# Patient Record
Sex: Male | Born: 1955
Health system: Southern US, Community
[De-identification: ages and names within clinical notes are randomized; demographics above are authoritative.]

## PROBLEM LIST (undated history)

## (undated) DIAGNOSIS — I10 Essential (primary) hypertension: Secondary | ICD-10-CM

## (undated) DIAGNOSIS — M5440 Lumbago with sciatica, unspecified side: Secondary | ICD-10-CM

## (undated) HISTORY — PX: TONSILLECTOMY: SHX5217

## (undated) HISTORY — DX: Essential (primary) hypertension: I10

## (undated) HISTORY — DX: Lumbago with sciatica, unspecified side: M54.40

## (undated) HISTORY — PX: FOOT SURGERY: SHX648

## (undated) HISTORY — PX: NOSE SURGERY: SHX723

---

## 1998-05-09 HISTORY — PX: KIDNEY STONE SURGERY: SHX686

## 2005-09-09 ENCOUNTER — Ambulatory Visit: Payer: Self-pay | Admitting: Urology

## 2009-06-01 DIAGNOSIS — E782 Mixed hyperlipidemia: Secondary | ICD-10-CM | POA: Insufficient documentation

## 2010-07-24 ENCOUNTER — Ambulatory Visit: Payer: Self-pay | Admitting: Family Medicine

## 2010-12-01 ENCOUNTER — Emergency Department: Payer: Self-pay | Admitting: Emergency Medicine

## 2011-02-09 ENCOUNTER — Ambulatory Visit: Payer: Self-pay | Admitting: Podiatry

## 2011-11-03 ENCOUNTER — Ambulatory Visit: Payer: Self-pay | Admitting: Family Medicine

## 2012-09-25 ENCOUNTER — Ambulatory Visit: Payer: Self-pay | Admitting: Podiatry

## 2012-09-25 LAB — CBC WITH DIFFERENTIAL/PLATELET
Basophil #: 0.1 10*3/uL (ref 0.0–0.1)
Basophil %: 1 %
HGB: 15.1 g/dL (ref 13.0–18.0)
Lymphocyte %: 17.1 %
MCH: 30.3 pg (ref 26.0–34.0)
Monocyte #: 0.7 x10 3/mm (ref 0.2–1.0)
Neutrophil %: 73.2 %
Platelet: 299 10*3/uL (ref 150–440)
RBC: 4.99 10*6/uL (ref 4.40–5.90)
RDW: 13.6 % (ref 11.5–14.5)
WBC: 7.9 10*3/uL (ref 3.8–10.6)

## 2012-09-28 ENCOUNTER — Ambulatory Visit: Payer: Self-pay | Admitting: Podiatry

## 2012-10-02 LAB — PATHOLOGY REPORT

## 2013-05-09 HISTORY — PX: DENTAL SURGERY: SHX609

## 2013-10-17 ENCOUNTER — Ambulatory Visit: Payer: Self-pay | Admitting: Podiatry

## 2013-10-17 LAB — CBC WITH DIFFERENTIAL/PLATELET
BASOS ABS: 0.1 10*3/uL (ref 0.0–0.1)
Basophil %: 0.9 %
EOS ABS: 0 10*3/uL (ref 0.0–0.7)
Eosinophil %: 0.5 %
HCT: 42.9 % (ref 40.0–52.0)
HGB: 14.9 g/dL (ref 13.0–18.0)
Lymphocyte #: 1.5 10*3/uL (ref 1.0–3.6)
Lymphocyte %: 21.1 %
MCH: 30 pg (ref 26.0–34.0)
MCHC: 34.7 g/dL (ref 32.0–36.0)
MCV: 87 fL (ref 80–100)
MONOS PCT: 8.9 %
Monocyte #: 0.6 x10 3/mm (ref 0.2–1.0)
Neutrophil #: 4.8 10*3/uL (ref 1.4–6.5)
Neutrophil %: 68.6 %
PLATELETS: 283 10*3/uL (ref 150–440)
RBC: 4.96 10*6/uL (ref 4.40–5.90)
RDW: 13.5 % (ref 11.5–14.5)
WBC: 7 10*3/uL (ref 3.8–10.6)

## 2013-10-25 ENCOUNTER — Ambulatory Visit: Payer: Self-pay | Admitting: Podiatry

## 2014-04-08 ENCOUNTER — Ambulatory Visit: Payer: Self-pay | Admitting: Family Medicine

## 2014-06-11 ENCOUNTER — Other Ambulatory Visit: Payer: Self-pay | Admitting: Neurosurgery

## 2014-06-11 DIAGNOSIS — M5416 Radiculopathy, lumbar region: Secondary | ICD-10-CM

## 2014-06-30 ENCOUNTER — Inpatient Hospital Stay
Admission: RE | Admit: 2014-06-30 | Discharge: 2014-06-30 | Disposition: A | Discharge status: Home / Self Care | Payer: Self-pay | Source: Ambulatory Visit | Attending: Neurosurgery | Admitting: Neurosurgery

## 2014-06-30 ENCOUNTER — Other Ambulatory Visit: Payer: Self-pay | Admitting: Neurosurgery

## 2014-06-30 ENCOUNTER — Inpatient Hospital Stay: Admission: RE | Admit: 2014-06-30 | Payer: Self-pay | Source: Ambulatory Visit

## 2014-06-30 DIAGNOSIS — M5416 Radiculopathy, lumbar region: Secondary | ICD-10-CM

## 2014-07-09 ENCOUNTER — Other Ambulatory Visit (HOSPITAL_COMMUNITY): Payer: Self-pay | Admitting: Diagnostic Radiology

## 2014-07-09 ENCOUNTER — Other Ambulatory Visit: Payer: Self-pay

## 2014-07-09 ENCOUNTER — Ambulatory Visit
Admission: RE | Admit: 2014-07-09 | Discharge: 2014-07-09 | Disposition: A | Discharge status: Home / Self Care | Payer: Self-pay | Source: Ambulatory Visit | Attending: Neurosurgery | Admitting: Neurosurgery

## 2014-07-09 ENCOUNTER — Ambulatory Visit
Admission: RE | Admit: 2014-07-09 | Discharge: 2014-07-09 | Disposition: A | Discharge status: Home / Self Care | Payer: No Typology Code available for payment source | Source: Ambulatory Visit | Attending: Neurosurgery | Admitting: Neurosurgery

## 2014-07-09 ENCOUNTER — Encounter (INDEPENDENT_AMBULATORY_CARE_PROVIDER_SITE_OTHER): Payer: Self-pay

## 2014-07-09 DIAGNOSIS — M5440 Lumbago with sciatica, unspecified side: Secondary | ICD-10-CM

## 2014-07-09 DIAGNOSIS — M5416 Radiculopathy, lumbar region: Secondary | ICD-10-CM

## 2014-07-09 MED ORDER — IOHEXOL 180 MG/ML  SOLN
15.0000 mL | Freq: Once | INTRAMUSCULAR | Status: AC | PRN
  Administered 2014-07-09: 15 mL via INTRATHECAL

## 2014-07-09 MED ORDER — ONDANSETRON HCL 4 MG/2ML IJ SOLN: 4.0000 mg | Freq: Four times a day (QID) | INTRAMUSCULAR | Status: DC | PRN

## 2014-07-09 MED ORDER — ONDANSETRON HCL 4 MG/2ML IJ SOLN
4.0000 mg | Freq: Once | INTRAMUSCULAR | Status: AC
  Administered 2014-07-09: 4 mg via INTRAMUSCULAR

## 2014-07-09 MED ORDER — DIAZEPAM 5 MG PO TABS
10.0000 mg | ORAL_TABLET | Freq: Once | ORAL | Status: AC
  Administered 2014-07-09: 10 mg via ORAL

## 2014-07-09 MED ORDER — MEPERIDINE HCL 100 MG/ML IJ SOLN
75.0000 mg | Freq: Once | INTRAMUSCULAR | Status: AC
  Administered 2014-07-09: 75 mg via INTRAMUSCULAR

## 2014-07-09 NOTE — Discharge Instructions (Signed)
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
## 2014-10-10 ENCOUNTER — Encounter: Payer: Self-pay | Admitting: Family Medicine

## 2014-10-10 ENCOUNTER — Ambulatory Visit (INDEPENDENT_AMBULATORY_CARE_PROVIDER_SITE_OTHER): Payer: No Typology Code available for payment source | Admitting: Family Medicine

## 2014-10-10 VITALS — BP 142/78 | HR 70 | Temp 98.0°F | Resp 16 | Wt 207.0 lb

## 2014-10-10 DIAGNOSIS — I10 Essential (primary) hypertension: Secondary | ICD-10-CM

## 2014-10-10 DIAGNOSIS — K625 Hemorrhage of anus and rectum: Secondary | ICD-10-CM | POA: Insufficient documentation

## 2014-10-10 DIAGNOSIS — Z131 Encounter for screening for diabetes mellitus: Secondary | ICD-10-CM | POA: Insufficient documentation

## 2014-10-10 DIAGNOSIS — M545 Low back pain: Secondary | ICD-10-CM

## 2014-10-10 DIAGNOSIS — S76219A Strain of adductor muscle, fascia and tendon of unspecified thigh, initial encounter: Secondary | ICD-10-CM | POA: Insufficient documentation

## 2014-10-10 DIAGNOSIS — M546 Pain in thoracic spine: Secondary | ICD-10-CM | POA: Insufficient documentation

## 2014-10-10 DIAGNOSIS — Z87442 Personal history of urinary calculi: Secondary | ICD-10-CM | POA: Insufficient documentation

## 2014-10-10 DIAGNOSIS — M5442 Lumbago with sciatica, left side: Secondary | ICD-10-CM | POA: Diagnosis not present

## 2014-10-10 DIAGNOSIS — G8929 Other chronic pain: Secondary | ICD-10-CM | POA: Insufficient documentation

## 2014-10-10 DIAGNOSIS — R0600 Dyspnea, unspecified: Secondary | ICD-10-CM | POA: Insufficient documentation

## 2014-10-10 MED ORDER — OXYCODONE-ACETAMINOPHEN 5-325 MG PO TABS: 1.0000 | ORAL_TABLET | Freq: Four times a day (QID) | ORAL | Status: DC | PRN

## 2014-10-10 NOTE — Patient Instructions (Signed)
Return in 3 months. Will update labs at that time.

## 2014-10-10 NOTE — Progress Notes (Signed)
Subjective:     Patient ID: Alexander Mcbride, male   DOB: 05-19-1955, 59 y.o.   MRN: [id number]  HPI  Chief Complaint  Patient presents with  . Follow-up    Hypertension, Lower back pain  States he is tolerating increased dose of amlodipine but has gone without medication for 3 days. He is seeing a" nutritionist" and is hoping her administrations will help his blood pressure and chronic back pain.   Review of Systems  Cardiovascular: Negative for leg swelling.  Neurological: Negative for headaches.       Objective:   Physical Exam  Constitutional: He appears well-developed and well-nourished.  Cardiovascular: Normal rate and regular rhythm.   Pulmonary/Chest: Breath sounds normal.  Musculoskeletal: He exhibits no edema (of lower extremities).       Assessment:     1. Essential (primary) hypertension Continue amlodipine  2. Left-sided low back pain with left-sided sciatica  - oxyCODONE-acetaminophen (PERCOCET/ROXICET) 5-325 MG per tablet; Take 1 tablet by mouth every 6 (six) hours as needed for severe pain.  Dispense: 90 tablet; Refill: 0    Plan:     Encouraged use of bp medication F/u in 3 months Update labs at that time

## 2014-11-07 ENCOUNTER — Other Ambulatory Visit: Payer: Self-pay | Admitting: Family Medicine

## 2014-11-07 ENCOUNTER — Telehealth: Payer: Self-pay | Admitting: Family Medicine

## 2014-11-07 DIAGNOSIS — M5442 Lumbago with sciatica, left side: Secondary | ICD-10-CM

## 2014-11-07 MED ORDER — OXYCODONE-ACETAMINOPHEN 5-325 MG PO TABS: 1.0000 | ORAL_TABLET | Freq: Four times a day (QID) | ORAL | Status: DC | PRN

## 2014-12-04 ENCOUNTER — Ambulatory Visit (INDEPENDENT_AMBULATORY_CARE_PROVIDER_SITE_OTHER): Payer: No Typology Code available for payment source | Admitting: Family Medicine

## 2014-12-04 ENCOUNTER — Encounter: Payer: Self-pay | Admitting: Family Medicine

## 2014-12-04 VITALS — BP 152/76 | HR 78 | Temp 97.6°F | Resp 18 | Ht 67.0 in | Wt 205.0 lb

## 2014-12-04 DIAGNOSIS — Z131 Encounter for screening for diabetes mellitus: Secondary | ICD-10-CM | POA: Insufficient documentation

## 2014-12-04 DIAGNOSIS — H6123 Impacted cerumen, bilateral: Secondary | ICD-10-CM

## 2014-12-04 DIAGNOSIS — M5442 Lumbago with sciatica, left side: Secondary | ICD-10-CM | POA: Diagnosis not present

## 2014-12-04 MED ORDER — OXYCODONE-ACETAMINOPHEN 5-325 MG PO TABS: 1.0000 | ORAL_TABLET | Freq: Four times a day (QID) | ORAL | Status: DC | PRN

## 2014-12-04 NOTE — Patient Instructions (Signed)
Minimize use of Q-tips. 

## 2014-12-04 NOTE — Progress Notes (Signed)
Subjective:     Patient ID: Alexander Mcbride, male   DOB: 05-Feb-1956, 59 y.o.   MRN: [id number]  HPI  Chief Complaint  Patient presents with  . Hearing Loss    After going swimming on Tuesday he has had trouble with hearing-"echo" sensation, slight pain and mostly in the Right ear.  He has tried to remove wax with Q-tips with little success. Denies pain or drainage, primarily decreased hearing Review of Systems  Constitutional: Negative for fever and chills.  Musculoskeletal:       States he will receive another back injection on 12/15/14.       Objective:   Physical Exam  Constitutional: He appears well-developed and well-nourished. No distress.  HENT:  Bilateral cerumen impaction. After irrigation per Tobi Bastos T.M's are intact and patient reports improvement. Appear to have sclerosis of both T.M.'s.       Assessment:    1. Cerumen impaction, bilatera - EAR CERUMEN REMOVAL  2. Left-sided low back pain with left-sided sciatica:monthly refill - oxyCODONE-acetaminophen (PERCOCET/ROXICET) 5-325 MG per tablet; Take 1 tablet by mouth every 6 (six) hours as needed for severe pain.  Dispense: 120 tablet; Refill: 0    Plan:    Minimize use of Q-tips

## 2015-01-08 ENCOUNTER — Other Ambulatory Visit: Payer: Self-pay | Admitting: Family Medicine

## 2015-01-08 DIAGNOSIS — M5442 Lumbago with sciatica, left side: Secondary | ICD-10-CM

## 2015-01-08 MED ORDER — OXYCODONE-ACETAMINOPHEN 5-325 MG PO TABS: 1.0000 | ORAL_TABLET | Freq: Four times a day (QID) | ORAL | Status: DC | PRN

## 2015-01-08 NOTE — Telephone Encounter (Signed)
Medication up front for pickup  ?

## 2015-01-08 NOTE — Telephone Encounter (Signed)
Patient has been advised. KW 

## 2015-01-08 NOTE — Telephone Encounter (Signed)
Refill request

## 2015-01-08 NOTE — Telephone Encounter (Signed)
oxyCODONE-acetaminophen (PERCOCET/ROXICET) 5-325 MG per tablet  Pt will come pick up  Call back is [phone]  Thanks teri

## 2015-01-14 ENCOUNTER — Encounter: Payer: Self-pay | Admitting: Family Medicine

## 2015-01-14 ENCOUNTER — Ambulatory Visit (INDEPENDENT_AMBULATORY_CARE_PROVIDER_SITE_OTHER): Payer: No Typology Code available for payment source | Admitting: Family Medicine

## 2015-01-14 VITALS — BP 124/80 | HR 66 | Temp 97.6°F | Resp 16 | Wt 197.6 lb

## 2015-01-14 DIAGNOSIS — I10 Essential (primary) hypertension: Secondary | ICD-10-CM

## 2015-01-14 DIAGNOSIS — M545 Low back pain, unspecified: Secondary | ICD-10-CM

## 2015-01-14 DIAGNOSIS — E785 Hyperlipidemia, unspecified: Secondary | ICD-10-CM | POA: Diagnosis not present

## 2015-01-14 NOTE — Patient Instructions (Signed)
We will call you with the lab results. 

## 2015-01-14 NOTE — Progress Notes (Signed)
Subjective:     Patient ID: Alexander Mcbride, male   DOB: 1955-11-06, 59 y.o.   MRN: [id number]  HPI  Chief Complaint  Patient presents with  . Hypertension    Patient is present in office for follow up from 10/10/14. Last office visit blood prssure in house was 142/78, patient was instructed to continue Amlodipine. Patient reports good compliance and tolerance of medication  . Back Pain    Patient is present for follow up from 10/10/14. Patient was seen in  office for left sided back pain with sciatica, patient was continued on Percocet 5-325mg   States he has recently received a second back injection for his chronic back pain but it has not helped. Reports he has developed tolerance to the oxycodone and realizes he may need pain center referral. 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 States he is tolerating losartan since being switched from amlodipine.   Review of Systems  Constitutional:       Continues to see a "nutritionist" to see if he can get off bp meds. States they are working on Management consultant".       Objective:   Physical Exam  Constitutional: He appears well-developed and well-nourished. No distress.  Cardiovascular: Normal rate and regular rhythm.   Pulmonary/Chest: Effort normal and breath sounds normal.  Musculoskeletal: He exhibits no edema (of lower extremities).       Assessment:    1. Essential (primary) hypertension - Comprehensive metabolic panel  2. Left-sided low back pain without sciatica - Ambulatory referral to Pain Clinic  3. Hyperlipidemia - Lipid panel    Plan:    Further f/u pending lab work.

## 2015-02-09 ENCOUNTER — Other Ambulatory Visit: Payer: Self-pay | Admitting: Family Medicine

## 2015-02-09 NOTE — Telephone Encounter (Signed)
Pt contacted office for refill request on the following medications: oxycodone-acetaminophen (PERCOCET/ROXICET) 5-325 MG per tablet. Thanks TNP

## 2015-02-10 ENCOUNTER — Other Ambulatory Visit: Payer: Self-pay | Admitting: Family Medicine

## 2015-02-10 ENCOUNTER — Telehealth: Payer: Self-pay | Admitting: Family Medicine

## 2015-02-10 DIAGNOSIS — M5442 Lumbago with sciatica, left side: Secondary | ICD-10-CM

## 2015-02-10 MED ORDER — OXYCODONE-ACETAMINOPHEN 5-325 MG PO TABS: 1.0000 | ORAL_TABLET | Freq: Four times a day (QID) | ORAL | Status: DC | PRN

## 2015-02-10 NOTE — Telephone Encounter (Signed)
Pain medication available for pickup

## 2015-02-10 NOTE — Telephone Encounter (Signed)
Have you seen this message in regards to Rx?

## 2015-02-10 NOTE — Telephone Encounter (Signed)
Left message on patients cellphone that prescription is available for pick up. KW

## 2015-02-10 NOTE — Telephone Encounter (Signed)
Pt called to see if Rx was ready/MW

## 2015-02-12 ENCOUNTER — Other Ambulatory Visit: Payer: Self-pay | Admitting: *Deleted

## 2015-02-19 ENCOUNTER — Encounter: Payer: Self-pay | Admitting: Pain Medicine

## 2015-02-19 ENCOUNTER — Ambulatory Visit: Payer: No Typology Code available for payment source | Attending: Pain Medicine | Admitting: Pain Medicine

## 2015-02-19 VITALS — BP 173/81 | HR 71 | Temp 98.2°F | Resp 16 | Ht 67.0 in | Wt 190.0 lb

## 2015-02-19 DIAGNOSIS — M5136 Other intervertebral disc degeneration, lumbar region: Secondary | ICD-10-CM | POA: Insufficient documentation

## 2015-02-19 DIAGNOSIS — Z9889 Other specified postprocedural states: Secondary | ICD-10-CM | POA: Insufficient documentation

## 2015-02-19 DIAGNOSIS — M533 Sacrococcygeal disorders, not elsewhere classified: Secondary | ICD-10-CM | POA: Diagnosis not present

## 2015-02-19 DIAGNOSIS — M19072 Primary osteoarthritis, left ankle and foot: Secondary | ICD-10-CM

## 2015-02-19 DIAGNOSIS — K219 Gastro-esophageal reflux disease without esophagitis: Secondary | ICD-10-CM | POA: Insufficient documentation

## 2015-02-19 DIAGNOSIS — M5126 Other intervertebral disc displacement, lumbar region: Secondary | ICD-10-CM | POA: Diagnosis not present

## 2015-02-19 DIAGNOSIS — Z87828 Personal history of other (healed) physical injury and trauma: Secondary | ICD-10-CM | POA: Diagnosis not present

## 2015-02-19 DIAGNOSIS — M48062 Spinal stenosis, lumbar region with neurogenic claudication: Secondary | ICD-10-CM | POA: Insufficient documentation

## 2015-02-19 DIAGNOSIS — M4806 Spinal stenosis, lumbar region: Secondary | ICD-10-CM | POA: Insufficient documentation

## 2015-02-19 DIAGNOSIS — G90522 Complex regional pain syndrome I of left lower limb: Secondary | ICD-10-CM | POA: Insufficient documentation

## 2015-02-19 DIAGNOSIS — M47816 Spondylosis without myelopathy or radiculopathy, lumbar region: Secondary | ICD-10-CM | POA: Insufficient documentation

## 2015-02-19 DIAGNOSIS — M5416 Radiculopathy, lumbar region: Secondary | ICD-10-CM | POA: Diagnosis not present

## 2015-02-19 DIAGNOSIS — M5134 Other intervertebral disc degeneration, thoracic region: Secondary | ICD-10-CM

## 2015-02-19 DIAGNOSIS — M79605 Pain in left leg: Secondary | ICD-10-CM | POA: Diagnosis present

## 2015-02-19 NOTE — Progress Notes (Signed)
Safety precautions to be maintained throughout the outpatient stay will include: orient to surroundings, keep bed in low position, maintain call bell within reach at all times, provide assistance with transfer out of bed and ambulation.  Patient given info on lumbar sympathetic block procedure.

## 2015-02-19 NOTE — Patient Instructions (Addendum)
PLAN   Continue present medication  Lumbar synthetic block to be performed at time return appointment  F/U PCP Dr.Chauvin  for evaliation of  BP and general medical  condition  F/U surgical evaluation. Follow-up with Dr. Gerlene FeeKritzer or of the surgeon as discussed  F/U neurological evaluation. May consider PNCV/EMG studies and other studies pending follow-up evaluations  Evaluation by podiatrist as discussed  May consider radiofrequency rhizolysis or intraspinal procedures pending response to present treatment and F/U evaluation   Patient to call [facility] should patient have concerns prior to scheduled return appointment. GENERAL RISKS AND COMPLICATIONS  What are the risk, side effects and possible complications? Generally speaking, most procedures are safe.  However, with any procedure there are risks, side effects, and the possibility of complications.  The risks and complications are dependent upon the sites that are lesioned, or the type of nerve block to be performed.  The closer the procedure is to the spine, the more serious the risks are.  Great care is taken when placing the radio frequency needles, block needles or lesioning probes, but sometimes complications can occur. 1. Infection: Any time there is an injection through the skin, there is a risk of infection.  This is why sterile conditions are used for these blocks.  There are four possible types of infection. 1. Localized skin infection. 2. Central Nervous System Infection-This can be in the form of Meningitis, which can be deadly. 3. Epidural Infections-This can be in the form of an epidural abscess, which can cause pressure inside of the spine, causing compression of the spinal cord with subsequent paralysis. This would require an emergency surgery to decompress, and there are no guarantees that the patient would recover from the paralysis. 4. Discitis-This is an infection of the intervertebral discs.  It occurs in  about 1% of discography procedures.  It is difficult to treat and it may lead to surgery.        2. Pain: the needles have to go through skin and soft tissues, will cause soreness.       3. Damage to internal structures:  The nerves to be lesioned may be near blood vessels or    other nerves which can be potentially damaged.       4. Bleeding: Bleeding is more common if the patient is taking blood thinners such as  aspirin, Coumadin, Ticiid, Plavix, etc., or if he/she have some genetic predisposition  such as hemophilia. Bleeding into the spinal canal can cause compression of the spinal  cord with subsequent paralysis.  This would require an emergency surgery to  decompress and there are no guarantees that the patient would recover from the  paralysis.       5. Pneumothorax:  Puncturing of a lung is a possibility, every time a needle is introduced in  the area of the chest or upper back.  Pneumothorax refers to free air around the  collapsed lung(s), inside of the thoracic cavity (chest cavity).  Another two possible  complications related to a similar event would include: Hemothorax and Chylothorax.   These are variations of the Pneumothorax, where instead of air around the collapsed  lung(s), you may have blood or chyle, respectively.       6. Spinal headaches: They may occur with any procedures in the area of the spine.       7. Persistent CSF (Cerebro-Spinal Fluid) leakage: This is a rare problem, but may occur  with prolonged intrathecal or epidural catheters  either due to the formation of a fistulous  track or a dural tear.       8. Nerve damage: By working so close to the spinal cord, there is always a possibility of  nerve damage, which could be as serious as a permanent spinal cord injury with  paralysis.       9. Death:  Although rare, severe deadly allergic reactions known as "Anaphylactic  reaction" can occur to any of the medications used.      10. Worsening of the symptoms:  We can always  make thing worse.  What are the chances of something like this happening? Chances of any of this occuring are extremely low.  By statistics, you have more of a chance of getting killed in a motor vehicle accident: while driving to the hospital than any of the above occurring .  Nevertheless, you should be aware that they are possibilities.  In general, it is similar to taking a shower.  Everybody knows that you can slip, hit your head and get killed.  Does that mean that you should not shower again?  Nevertheless always keep in mind that statistics do not mean anything if you happen to be on the wrong side of them.  Even if a procedure has a 1 (one) in a 1,000,000 (million) chance of going wrong, it you happen to be that one..Also, keep in mind that by statistics, you have more of a chance of having something go wrong when taking medications.  Who should not have this procedure? If you are on a blood thinning medication (e.g. Coumadin, Plavix, see list of "Blood Thinners"), or if you have an active infection going on, you should not have the procedure.  If you are taking any blood thinners, please inform your physician.  How should I prepare for this procedure?  Do not eat or drink anything at least six hours prior to the procedure.  Bring a driver with you .  It cannot be a taxi.  Come accompanied by an adult that can drive you back, and that is strong enough to help you if your legs get weak or numb from the local anesthetic.  Take all of your medicines the morning of the procedure with just enough water to swallow them.  If you have diabetes, make sure that you are scheduled to have your procedure done first thing in the morning, whenever possible.  If you have diabetes, take only half of your insulin dose and notify our nurse that you have done so as soon as you arrive at the clinic.  If you are diabetic, but only take blood sugar pills (oral hypoglycemic), then do not take them on the  morning of your procedure.  You may take them after you have had the procedure.  Do not take aspirin or any aspirin-containing medications, at least eleven (11) days prior to the procedure.  They may prolong bleeding.  Wear loose fitting clothing that may be easy to take off and that you would not mind if it got stained with Betadine or blood.  Do not wear any jewelry or perfume  Remove any nail coloring.  It will interfere with some of our monitoring equipment.  NOTE: Remember that this is not meant to be interpreted as a complete list of all possible complications.  Unforeseen problems may occur.  BLOOD THINNERS The following drugs contain aspirin or other products, which can cause increased bleeding during surgery and should not be taken for  2 weeks prior to and 1 week after surgery.  If you should need take something for relief of minor pain, you may take acetaminophen which is found in Tylenol,m Datril, Anacin-3 and Panadol. It is not blood thinner. The products listed below are.  Do not take any of the products listed below in addition to any listed on your instruction sheet.  A.P.C or A.P.C with Codeine Codeine Phosphate Capsules #3 Ibuprofen Ridaura  ABC compound Congesprin Imuran rimadil  Advil Cope Indocin Robaxisal  Alka-Seltzer Effervescent Pain Reliever and Antacid Coricidin or Coricidin-D  Indomethacin Rufen  Alka-Seltzer plus Cold Medicine Cosprin Ketoprofen S-A-C Tablets  Anacin Analgesic Tablets or Capsules Coumadin Korlgesic Salflex  Anacin Extra Strength Analgesic tablets or capsules CP-2 Tablets Lanoril Salicylate  Anaprox Cuprimine Capsules Levenox Salocol  Anexsia-D Dalteparin Magan Salsalate  Anodynos Darvon compound Magnesium Salicylate Sine-off  Ansaid Dasin Capsules Magsal Sodium Salicylate  Anturane Depen Capsules Marnal Soma  APF Arthritis pain formula Dewitt's Pills Measurin Stanback  Argesic Dia-Gesic Meclofenamic Sulfinpyrazone  Arthritis Bayer Timed  Release Aspirin Diclofenac Meclomen Sulindac  Arthritis pain formula Anacin Dicumarol Medipren Supac  Analgesic (Safety coated) Arthralgen Diffunasal Mefanamic Suprofen  Arthritis Strength Bufferin Dihydrocodeine Mepro Compound Suprol  Arthropan liquid Dopirydamole Methcarbomol with Aspirin Synalgos  ASA tablets/Enseals Disalcid Micrainin Tagament  Ascriptin Doan's Midol Talwin  Ascriptin A/D Dolene Mobidin Tanderil  Ascriptin Extra Strength Dolobid Moblgesic Ticlid  Ascriptin with Codeine Doloprin or Doloprin with Codeine Momentum Tolectin  Asperbuf Duoprin Mono-gesic Trendar  Aspergum Duradyne Motrin or Motrin IB Triminicin  Aspirin plain, buffered or enteric coated Durasal Myochrisine Trigesic  Aspirin Suppositories Easprin Nalfon Trillsate  Aspirin with Codeine Ecotrin Regular or Extra Strength Naprosyn Uracel  Atromid-S Efficin Naproxen Ursinus  Auranofin Capsules Elmiron Neocylate Vanquish  Axotal Emagrin Norgesic Verin  Azathioprine Empirin or Empirin with Codeine Normiflo Vitamin E  Azolid Emprazil Nuprin Voltaren  Bayer Aspirin plain, buffered or children's or timed BC Tablets or powders Encaprin Orgaran Warfarin Sodium  Buff-a-Comp Enoxaparin Orudis Zorpin  Buff-a-Comp with Codeine Equegesic Os-Cal-Gesic   Buffaprin Excedrin plain, buffered or Extra Strength Oxalid   Bufferin Arthritis Strength Feldene Oxphenbutazone   Bufferin plain or Extra Strength Feldene Capsules Oxycodone with Aspirin   Bufferin with Codeine Fenoprofen Fenoprofen Pabalate or Pabalate-SF   Buffets II Flogesic Panagesic   Buffinol plain or Extra Strength Florinal or Florinal with Codeine Panwarfarin   Buf-Tabs Flurbiprofen Penicillamine   Butalbital Compound Four-way cold tablets Penicillin   Butazolidin Fragmin Pepto-Bismol   Carbenicillin Geminisyn Percodan   Carna Arthritis Reliever Geopen Persantine   Carprofen Gold's salt Persistin   Chloramphenicol Goody's Phenylbutazone   Chloromycetin  Haltrain Piroxlcam   Clmetidine heparin Plaquenil   Cllnoril Hyco-pap Ponstel   Clofibrate Hydroxy chloroquine Propoxyphen         Before stopping any of these medications, be sure to consult the physician who ordered them.  Some, such as Coumadin (Warfarin) are ordered to prevent or treat serious conditions such as "deep thrombosis", "pumonary embolisms", and other heart problems.  The amount of time that you may need off of the medication may also vary with the medication and the reason for which you were taking it.  If you are taking any of these medications, please make sure you notify your pain physician before you undergo any procedures.         GENERAL RISKS AND COMPLICATIONS  What are the risk, side effects and possible complications? Generally speaking, most procedures are safe.  However, with any  procedure there are risks, side effects, and the possibility of complications.  The risks and complications are dependent upon the sites that are lesioned, or the type of nerve block to be performed.  The closer the procedure is to the spine, the more serious the risks are.  Great care is taken when placing the radio frequency needles, block needles or lesioning probes, but sometimes complications can occur. 2. Infection: Any time there is an injection through the skin, there is a risk of infection.  This is why sterile conditions are used for these blocks.  There are four possible types of infection. 1. Localized skin infection. 2. Central Nervous System Infection-This can be in the form of Meningitis, which can be deadly. 3. Epidural Infections-This can be in the form of an epidural abscess, which can cause pressure inside of the spine, causing compression of the spinal cord with subsequent paralysis. This would require an emergency surgery to decompress, and there are no guarantees that the patient would recover from the paralysis. 4. Discitis-This is an infection of the intervertebral  discs.  It occurs in about 1% of discography procedures.  It is difficult to treat and it may lead to surgery.        2. Pain: the needles have to go through skin and soft tissues, will cause soreness.       3. Damage to internal structures:  The nerves to be lesioned may be near blood vessels or    other nerves which can be potentially damaged.       4. Bleeding: Bleeding is more common if the patient is taking blood thinners such as  aspirin, Coumadin, Ticiid, Plavix, etc., or if he/she have some genetic predisposition  such as hemophilia. Bleeding into the spinal canal can cause compression of the spinal  cord with subsequent paralysis.  This would require an emergency surgery to  decompress and there are no guarantees that the patient would recover from the  paralysis.       5. Pneumothorax:  Puncturing of a lung is a possibility, every time a needle is introduced in  the area of the chest or upper back.  Pneumothorax refers to free air around the  collapsed lung(s), inside of the thoracic cavity (chest cavity).  Another two possible  complications related to a similar event would include: Hemothorax and Chylothorax.   These are variations of the Pneumothorax, where instead of air around the collapsed  lung(s), you may have blood or chyle, respectively.       6. Spinal headaches: They may occur with any procedures in the area of the spine.       7. Persistent CSF (Cerebro-Spinal Fluid) leakage: This is a rare problem, but may occur  with prolonged intrathecal or epidural catheters either due to the formation of a fistulous  track or a dural tear.       8. Nerve damage: By working so close to the spinal cord, there is always a possibility of  nerve damage, which could be as serious as a permanent spinal cord injury with  paralysis.       9. Death:  Although rare, severe deadly allergic reactions known as "Anaphylactic  reaction" can occur to any of the medications used.      10. Worsening of the  symptoms:  We can always make thing worse.  What are the chances of something like this happening? Chances of any of this occuring are extremely low.  By statistics, you  have more of a chance of getting killed in a motor vehicle accident: while driving to the hospital than any of the above occurring .  Nevertheless, you should be aware that they are possibilities.  In general, it is similar to taking a shower.  Everybody knows that you can slip, hit your head and get killed.  Does that mean that you should not shower again?  Nevertheless always keep in mind that statistics do not mean anything if you happen to be on the wrong side of them.  Even if a procedure has a 1 (one) in a 1,000,000 (million) chance of going wrong, it you happen to be that one..Also, keep in mind that by statistics, you have more of a chance of having something go wrong when taking medications.  Who should not have this procedure? If you are on a blood thinning medication (e.g. Coumadin, Plavix, see list of "Blood Thinners"), or if you have an active infection going on, you should not have the procedure.  If you are taking any blood thinners, please inform your physician.  How should I prepare for this procedure?  Do not eat or drink anything at least six hours prior to the procedure.  Bring a driver with you .  It cannot be a taxi.  Come accompanied by an adult that can drive you back, and that is strong enough to help you if your legs get weak or numb from the local anesthetic.  Take all of your medicines the morning of the procedure with just enough water to swallow them.  If you have diabetes, make sure that you are scheduled to have your procedure done first thing in the morning, whenever possible.  If you have diabetes, take only half of your insulin dose and notify our nurse that you have done so as soon as you arrive at the clinic.  If you are diabetic, but only take blood sugar pills (oral hypoglycemic), then do  not take them on the morning of your procedure.  You may take them after you have had the procedure.  Do not take aspirin or any aspirin-containing medications, at least eleven (11) days prior to the procedure.  They may prolong bleeding.  Wear loose fitting clothing that may be easy to take off and that you would not mind if it got stained with Betadine or blood.  Do not wear any jewelry or perfume  Remove any nail coloring.  It will interfere with some of our monitoring equipment.  NOTE: Remember that this is not meant to be interpreted as a complete list of all possible complications.  Unforeseen problems may occur.  BLOOD THINNERS The following drugs contain aspirin or other products, which can cause increased bleeding during surgery and should not be taken for 2 weeks prior to and 1 week after surgery.  If you should need take something for relief of minor pain, you may take acetaminophen which is found in Tylenol,m Datril, Anacin-3 and Panadol. It is not blood thinner. The products listed below are.  Do not take any of the products listed below in addition to any listed on your instruction sheet.  A.P.C or A.P.C with Codeine Codeine Phosphate Capsules #3 Ibuprofen Ridaura  ABC compound Congesprin Imuran rimadil  Advil Cope Indocin Robaxisal  Alka-Seltzer Effervescent Pain Reliever and Antacid Coricidin or Coricidin-D  Indomethacin Rufen  Alka-Seltzer plus Cold Medicine Cosprin Ketoprofen S-A-C Tablets  Anacin Analgesic Tablets or Capsules Coumadin Korlgesic Salflex  Anacin Extra Strength Analgesic tablets  or capsules CP-2 Tablets Lanoril Salicylate  Anaprox Cuprimine Capsules Levenox Salocol  Anexsia-D Dalteparin Magan Salsalate  Anodynos Darvon compound Magnesium Salicylate Sine-off  Ansaid Dasin Capsules Magsal Sodium Salicylate  Anturane Depen Capsules Marnal Soma  APF Arthritis pain formula Dewitt's Pills Measurin Stanback  Argesic Dia-Gesic Meclofenamic Sulfinpyrazone   Arthritis Bayer Timed Release Aspirin Diclofenac Meclomen Sulindac  Arthritis pain formula Anacin Dicumarol Medipren Supac  Analgesic (Safety coated) Arthralgen Diffunasal Mefanamic Suprofen  Arthritis Strength Bufferin Dihydrocodeine Mepro Compound Suprol  Arthropan liquid Dopirydamole Methcarbomol with Aspirin Synalgos  ASA tablets/Enseals Disalcid Micrainin Tagament  Ascriptin Doan's Midol Talwin  Ascriptin A/D Dolene Mobidin Tanderil  Ascriptin Extra Strength Dolobid Moblgesic Ticlid  Ascriptin with Codeine Doloprin or Doloprin with Codeine Momentum Tolectin  Asperbuf Duoprin Mono-gesic Trendar  Aspergum Duradyne Motrin or Motrin IB Triminicin  Aspirin plain, buffered or enteric coated Durasal Myochrisine Trigesic  Aspirin Suppositories Easprin Nalfon Trillsate  Aspirin with Codeine Ecotrin Regular or Extra Strength Naprosyn Uracel  Atromid-S Efficin Naproxen Ursinus  Auranofin Capsules Elmiron Neocylate Vanquish  Axotal Emagrin Norgesic Verin  Azathioprine Empirin or Empirin with Codeine Normiflo Vitamin E  Azolid Emprazil Nuprin Voltaren  Bayer Aspirin plain, buffered or children's or timed BC Tablets or powders Encaprin Orgaran Warfarin Sodium  Buff-a-Comp Enoxaparin Orudis Zorpin  Buff-a-Comp with Codeine Equegesic Os-Cal-Gesic   Buffaprin Excedrin plain, buffered or Extra Strength Oxalid   Bufferin Arthritis Strength Feldene Oxphenbutazone   Bufferin plain or Extra Strength Feldene Capsules Oxycodone with Aspirin   Bufferin with Codeine Fenoprofen Fenoprofen Pabalate or Pabalate-SF   Buffets II Flogesic Panagesic   Buffinol plain or Extra Strength Florinal or Florinal with Codeine Panwarfarin   Buf-Tabs Flurbiprofen Penicillamine   Butalbital Compound Four-way cold tablets Penicillin   Butazolidin Fragmin Pepto-Bismol   Carbenicillin Geminisyn Percodan   Carna Arthritis Reliever Geopen Persantine   Carprofen Gold's salt Persistin   Chloramphenicol Goody's Phenylbutazone    Chloromycetin Haltrain Piroxlcam   Clmetidine heparin Plaquenil   Cllnoril Hyco-pap Ponstel   Clofibrate Hydroxy chloroquine Propoxyphen         Before stopping any of these medications, be sure to consult the physician who ordered them.  Some, such as Coumadin (Warfarin) are ordered to prevent or treat serious conditions such as "deep thrombosis", "pumonary embolisms", and other heart problems.  The amount of time that you may need off of the medication may also vary with the medication and the reason for which you were taking it.  If you are taking any of these medications, please make sure you notify your pain physician before you undergo any procedures.         Lumbar Sympathetic Block Patient Information  Description: The lumbar plexus is a group of nerves that are part of the sympathetic nervous system.  These nerves supply organs in the pelvis and legs.  Lumbar sympathetic blocks are utilized for the diagnosis and treatment of painful conditions in these areas.   The lumbar plexus is located on both sides of the aorta at approximately the level of the second lumbar vertebral body.  The block will be performed with you lying on your abdomen with a pillow underneath.  Using direct x-ray guidance,   The plexus will be located on both sides of the spine.  Numbing medicine will be used to deaden the skin prior to needle insertion.  In most cases, a small amount of sedation can be give by IV prior to the numbing medicine.  One or two small needles will  be placed near the plexus and local anesthetic will be injected.  This may make your leg(s) feel warm.  The Entire block usually lasts about 15-25 minutes.  Conditions which may be treated by lumbar sympathetic block:   Reflex sympathetic dystrophy  Phantom limb pain  Peripheral neuropathy  Peripheral vascular disease ( inadequate blood flow )  Cancer pain of pelvis, leg and kidney  Preparation for the injection:  1. Do note eat  any solid food or diary products within 6 hours of your appointment. 2. You may drink clear liquids up to 2 hours before appointment.  Clear liquids include water, black coffee, juice or soda.  No milk or cream please. 3. You may take your regular medication, including pain medications, with a sip of water before you appointment.  Diabetics should hold regular insulin ( if taken separately ) and take 1/2 NPH dose the morning of the procedure .  Carry some sugar containing items with you to your appointment. 4. A driver must accompany you and be prepared to drive you home after your procedure. 5. Bring all your current medication with you. 6. An IV may be inserted and sedation may be given at the discretion of the physician.  7. A blood pressure cuff, EKG and other monitors will often be applied during the procedure.  Some patients may need to have extra oxygen administered for a short period. 8. You will be asked to provide medical information, including your allergies and medications, prior to the procedure.  We must know immediately if your taking blood thinners (like Coumadin/Warfarin) or if you are allergic to IV iodine contrast (dye).  We must know if you could possibly be pregnant.  Possible side-effects   Bleeding from needle site or deeper  Infection (rare, can require surgery)  Nerve injury (rare)  Numbness & tingling (temporary)  Collapsed lung (rare)  Spinal headache (a headache worse with upright posture)  Light-headedness (temporary)  Pain at injection site (several days)  Decreased blood pressure (temporary)  Weakness in legs (temporary)  Seizure or other drug reaction (rare)  Call if you experience:   Fever/chills associated with headache or increased back/ neck pain  Headache worsened by an upright position  New onset weakness or numbness of an extremity below the injection site  Hives or difficulty breathing ( go to the emergency room)  Inflammation or  drainage at the injections site(s)  New symptoms which are concerning to you  Please note:  If effective, we will often do a series of 2-3 injections spaced 3-6 weeks apart to maximally decrease your pain.  If initial series is effective, you may be a candidate for a more permanent block of the lumbar sympathetic plexus.  If you have any questions please call [phone] [facility]
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
## 2015-03-10 ENCOUNTER — Other Ambulatory Visit: Payer: Self-pay | Admitting: Family Medicine

## 2015-03-11 ENCOUNTER — Telehealth: Payer: Self-pay | Admitting: Family Medicine

## 2015-03-11 NOTE — Telephone Encounter (Signed)
Pt needs refill on his oxyCODONE-acetaminophen (PERCOCET/ROXICET) 5-325 MG tablet   Call back is [phone]  Thanks [name]

## 2015-03-11 NOTE — Telephone Encounter (Signed)
Refill

## 2015-03-12 ENCOUNTER — Other Ambulatory Visit: Payer: Self-pay | Admitting: Family Medicine

## 2015-03-12 DIAGNOSIS — M5442 Lumbago with sciatica, left side: Secondary | ICD-10-CM

## 2015-03-12 MED ORDER — OXYCODONE-ACETAMINOPHEN 5-325 MG PO TABS
1.0000 | ORAL_TABLET | Freq: Four times a day (QID) | ORAL | Status: DC | PRN
Start: 2015-03-12 — End: 2015-04-10

## 2015-03-12 NOTE — Telephone Encounter (Signed)
RX AVAILABLE FOR PICKUP

## 2015-03-12 NOTE — Telephone Encounter (Signed)
Patient has been advised that Rx is available for pick up. KW 

## 2015-04-10 ENCOUNTER — Other Ambulatory Visit: Payer: Self-pay | Admitting: Family Medicine

## 2015-04-10 ENCOUNTER — Telehealth: Payer: Self-pay | Admitting: Family Medicine

## 2015-04-10 DIAGNOSIS — M5442 Lumbago with sciatica, left side: Secondary | ICD-10-CM

## 2015-04-10 MED ORDER — OXYCODONE-ACETAMINOPHEN 5-325 MG PO TABS: 1.0000 | ORAL_TABLET | Freq: Four times a day (QID) | ORAL | Status: DC | PRN

## 2015-04-10 NOTE — Telephone Encounter (Signed)
Notified patient of prescription. KW 

## 2015-04-10 NOTE — Telephone Encounter (Signed)
Pt contacted office for refill request on the following medications: oxyCODONE-acetaminophen (PERCOCET/ROXICET) 5-325 MG tablet. Thanks TNP

## 2015-04-10 NOTE — Telephone Encounter (Signed)
Refill request

## 2015-04-10 NOTE — Telephone Encounter (Signed)
Pain rx ready for pick up  

## 2015-04-13 ENCOUNTER — Ambulatory Visit: Payer: No Typology Code available for payment source | Admitting: Pain Medicine

## 2015-05-12 ENCOUNTER — Other Ambulatory Visit: Payer: Self-pay | Admitting: Family Medicine

## 2015-05-12 ENCOUNTER — Telehealth: Payer: Self-pay | Admitting: Family Medicine

## 2015-05-12 DIAGNOSIS — M5442 Lumbago with sciatica, left side: Secondary | ICD-10-CM

## 2015-05-12 MED ORDER — OXYCODONE-ACETAMINOPHEN 5-325 MG PO TABS: 1.0000 | ORAL_TABLET | Freq: Four times a day (QID) | ORAL | Status: DC | PRN

## 2015-05-12 NOTE — Telephone Encounter (Signed)
Patient has been advised. KW 

## 2015-05-12 NOTE — Telephone Encounter (Signed)
Pt contacted office for refill request on the following medications: °oxyCODONE-acetaminophen (PERCOCET/ROXICET) 5-325 MG tablet. Thanks TNP °

## 2015-05-12 NOTE — Telephone Encounter (Signed)
Medication available for pick up. 

## 2015-05-12 NOTE — Telephone Encounter (Signed)
Refill request

## 2015-06-08 ENCOUNTER — Telehealth: Payer: Self-pay | Admitting: Family Medicine

## 2015-06-08 ENCOUNTER — Other Ambulatory Visit: Payer: Self-pay | Admitting: Family Medicine

## 2015-06-08 NOTE — Telephone Encounter (Signed)
Pt needs refill oxyCODONE-acetaminophen (PERCOCET/ROXICET) 5-325 MG tablet   Please call when ready.  Call back # [phone]  Thanks Barth Kirks

## 2015-06-08 NOTE — Telephone Encounter (Signed)
Refill request. KW 

## 2015-06-09 ENCOUNTER — Other Ambulatory Visit: Payer: Self-pay | Admitting: Family Medicine

## 2015-06-10 ENCOUNTER — Other Ambulatory Visit: Payer: Self-pay | Admitting: Family Medicine

## 2015-06-10 DIAGNOSIS — M5442 Lumbago with sciatica, left side: Secondary | ICD-10-CM

## 2015-06-10 MED ORDER — OXYCODONE-ACETAMINOPHEN 5-325 MG PO TABS: 1.0000 | ORAL_TABLET | Freq: Four times a day (QID) | ORAL | Status: DC | PRN

## 2015-06-10 NOTE — Telephone Encounter (Signed)
rx available for pickup 

## 2015-06-10 NOTE — Telephone Encounter (Signed)
Patient has been advised of Rx. KW

## 2015-07-08 ENCOUNTER — Telehealth: Payer: Self-pay | Admitting: Family Medicine

## 2015-07-08 NOTE — Telephone Encounter (Signed)
Refill request

## 2015-07-08 NOTE — Telephone Encounter (Signed)
Pt contacted office for refill request on the following medications:  oxyCODONE-acetaminophen (PERCOCET/ROXICET) 5-325 MG tablet.  CB#[phone]/MW ° °

## 2015-07-09 ENCOUNTER — Other Ambulatory Visit: Payer: Self-pay | Admitting: Family Medicine

## 2015-07-09 DIAGNOSIS — M5442 Lumbago with sciatica, left side: Secondary | ICD-10-CM

## 2015-07-09 MED ORDER — OXYCODONE-ACETAMINOPHEN 5-325 MG PO TABS: 1.0000 | ORAL_TABLET | Freq: Four times a day (QID) | ORAL | Status: DC | PRN

## 2015-07-09 NOTE — Telephone Encounter (Signed)
Patient was advised, he states that he is no longer going to pain clinic he states that it dosent work for him and cost is to high. KW

## 2015-07-09 NOTE — Telephone Encounter (Signed)
rx available for pickup. Is he still going to the pain clinic?

## 2015-07-16 ENCOUNTER — Ambulatory Visit (INDEPENDENT_AMBULATORY_CARE_PROVIDER_SITE_OTHER): Payer: Self-pay | Admitting: Family Medicine

## 2015-07-16 ENCOUNTER — Encounter: Payer: Self-pay | Admitting: Family Medicine

## 2015-07-16 VITALS — BP 130/80 | HR 78 | Temp 98.3°F | Resp 16 | Wt 201.8 lb

## 2015-07-16 DIAGNOSIS — M545 Low back pain, unspecified: Secondary | ICD-10-CM

## 2015-07-16 DIAGNOSIS — G8929 Other chronic pain: Secondary | ICD-10-CM

## 2015-07-16 DIAGNOSIS — R6882 Decreased libido: Secondary | ICD-10-CM

## 2015-07-16 MED ORDER — PREGABALIN 50 MG PO CAPS
50.0000 mg | ORAL_CAPSULE | Freq: Three times a day (TID) | ORAL | Status: DC
Start: 2015-07-16 — End: 2016-03-11

## 2015-07-16 NOTE — Progress Notes (Signed)
Subjective:     Patient ID: Alexander Mcbride, male   DOB: 03-Nov-1955, 60 y.o.   MRN: [id number]  HPI  Chief Complaint  Patient presents with  . Back Pain    Patient returns to office today to discuss pain management for chronic back pain. Patient was referred to pain clinic but states that he is unable to afford expenses for medication and has had trouble obtaining insurance  . Hypertension    Patient would like to address fluctuation of blood pressure, patient reports that he has bought a electronic cough and has noticed that his blood pressure has been up and down. Patient denies any episodes of dizziness or headache but states that he hears a "buzzing sound in his head"   States he currently has disability and Medicare Part A. Reluctant to pay for Part B. Has tried gabapentin in the past but reports it gave him headaches. Sees a chiropractor in [place] and continues to see an Secretary/administratoralternative practitioner. Also his current girlfriend is in her 2940's and he has noticed a decline in his libido, but no erectile or ejaculation dysfunction. He is concerned about his testosterone level. I suggested that he may be having age related decrease in desire/frequency.   Review of Systems  Musculoskeletal:       Current pain management helps him sleep at night. He is coaching a softball team. Has seen neurosurgery, Dr. Gerlene FeeKritzer, in the past. Patient reports that surgery was an option but prefers alternative treatments.       Objective:   Physical Exam  Constitutional: He appears well-developed and well-nourished. No distress.       Assessment:    1. Chronic low back pain - pregabalin (LYRICA) 50 MG capsule; Take 1 capsule (50 mg total) by mouth 3 (three) times daily.  Dispense: 90 capsule; Refill: 0  2. Libido, decreased - TESTOSTERONE, TOTAL, LC/MS - Testosterone    Plan:    Further f/u pending lab results. Bring blood pressure cuff in for check in the office.

## 2015-07-16 NOTE — Patient Instructions (Addendum)
Bring your bp cuff in for a nurse bp check. Let me know how the Lyrica does for your back pain. We will call you with the lab results.

## 2015-07-17 ENCOUNTER — Other Ambulatory Visit: Payer: Self-pay | Admitting: Family Medicine

## 2015-07-19 LAB — TESTOSTERONE, TOTAL, LC/MS: TESTOSTERONE, TOTAL: 577.9 ng/dL (ref 348.0–1197.0)

## 2015-07-19 LAB — TESTOSTERONE: Testosterone: 454 ng/dL (ref 348–1197)

## 2015-08-07 ENCOUNTER — Telehealth: Payer: Self-pay | Admitting: Family Medicine

## 2015-08-07 NOTE — Telephone Encounter (Signed)
Please review. Thanks!  

## 2015-08-07 NOTE — Telephone Encounter (Signed)
Pt contacted office for refill request on the following medications:  oxyCODONE-acetaminophen (PERCOCET/ROXICET) 5-325 MG tablet.  CB#[phone]/MW ° °

## 2015-08-08 ENCOUNTER — Other Ambulatory Visit: Payer: Self-pay | Admitting: Family Medicine

## 2015-08-10 ENCOUNTER — Other Ambulatory Visit: Payer: Self-pay | Admitting: Family Medicine

## 2015-08-10 DIAGNOSIS — M5442 Lumbago with sciatica, left side: Secondary | ICD-10-CM

## 2015-08-10 MED ORDER — OXYCODONE-ACETAMINOPHEN 5-325 MG PO TABS: 1.0000 | ORAL_TABLET | Freq: Four times a day (QID) | ORAL | Status: DC | PRN

## 2015-08-10 NOTE — Telephone Encounter (Signed)
Pt advised. Thanks TNP  °

## 2015-08-10 NOTE — Telephone Encounter (Signed)
Medication available for pick up. 

## 2015-08-16 ENCOUNTER — Other Ambulatory Visit: Payer: Self-pay | Admitting: Family Medicine

## 2015-09-08 ENCOUNTER — Other Ambulatory Visit: Payer: Self-pay | Admitting: Family Medicine

## 2015-09-08 ENCOUNTER — Telehealth: Payer: Self-pay | Admitting: Family Medicine

## 2015-09-08 DIAGNOSIS — M5442 Lumbago with sciatica, left side: Secondary | ICD-10-CM

## 2015-09-08 MED ORDER — OXYCODONE-ACETAMINOPHEN 5-325 MG PO TABS: 1.0000 | ORAL_TABLET | Freq: Four times a day (QID) | ORAL | Status: DC | PRN

## 2015-09-08 NOTE — Telephone Encounter (Signed)
Pt advised. Thanks TNP  °

## 2015-09-08 NOTE — Telephone Encounter (Signed)
Pt contacted office for refill request on the following medications: oxyCODONE-acetaminophen (PERCOCET/ROXICET) 5-325 MG tablet.  Last written: 08/10/15 Last OV: 07/16/15 Please advise. Thanks TNP

## 2015-09-08 NOTE — Telephone Encounter (Signed)
Medication available for pick up. 

## 2015-09-15 ENCOUNTER — Ambulatory Visit (INDEPENDENT_AMBULATORY_CARE_PROVIDER_SITE_OTHER): Payer: Self-pay | Admitting: Family Medicine

## 2015-09-15 ENCOUNTER — Encounter: Payer: Self-pay | Admitting: Family Medicine

## 2015-09-15 VITALS — BP 130/60 | HR 86 | Temp 97.7°F | Resp 16 | Wt 202.4 lb

## 2015-09-15 DIAGNOSIS — S00451A Superficial foreign body of right ear, initial encounter: Secondary | ICD-10-CM

## 2015-09-15 NOTE — Patient Instructions (Signed)
F/u as needed

## 2015-09-15 NOTE — Progress Notes (Signed)
Subjective:     Patient ID: Alexander Mcbride, male   DOB: May 29, 1955, 60 y.o.   MRN: [id number]  HPI  Chief Complaint  Patient presents with  . Foreign Body in Ear    Patient comes in office today with concerns of blue tooth ear piece bud lodged into his right ear  Has tried to get it out himself but feels he may have pushed it in further.   Review of Systems     Objective:   Physical Exam  Constitutional: He appears well-developed and well-nourished. No distress.  HENT:  R ear canal with f.b.as described above. Removed atraumatically with splinter forceps. TM intact after procedure.       Assessment:    1. Non-penetrating foreign body in ear canal, right, initial encounter     Plan:    F/u as needed.

## 2015-10-13 ENCOUNTER — Telehealth: Payer: Self-pay | Admitting: Family Medicine

## 2015-10-13 ENCOUNTER — Other Ambulatory Visit: Payer: Self-pay | Admitting: Family Medicine

## 2015-10-13 DIAGNOSIS — M5442 Lumbago with sciatica, left side: Secondary | ICD-10-CM

## 2015-10-13 MED ORDER — OXYCODONE-ACETAMINOPHEN 5-325 MG PO TABS: 1.0000 | ORAL_TABLET | Freq: Four times a day (QID) | ORAL | Status: DC | PRN

## 2015-10-13 NOTE — Telephone Encounter (Signed)
Pt calling for refill oxyCODONE-acetaminophen (PERCOCET/ROXICET) 5-325 MG tablet  Please call when ready to pick up.  [phone]  Thanks [name]

## 2015-10-13 NOTE — Telephone Encounter (Signed)
Ready for pick up

## 2015-10-13 NOTE — Telephone Encounter (Signed)
Patient has been advised. KW 

## 2015-10-13 NOTE — Telephone Encounter (Signed)
Refill request. KW 

## 2015-11-12 ENCOUNTER — Other Ambulatory Visit: Payer: Self-pay | Admitting: Family Medicine

## 2015-11-12 ENCOUNTER — Telehealth: Payer: Self-pay | Admitting: Family Medicine

## 2015-11-12 DIAGNOSIS — M5442 Lumbago with sciatica, left side: Secondary | ICD-10-CM

## 2015-11-12 MED ORDER — OXYCODONE-ACETAMINOPHEN 5-325 MG PO TABS: 1.0000 | ORAL_TABLET | Freq: Four times a day (QID) | ORAL | Status: DC | PRN

## 2015-11-12 NOTE — Telephone Encounter (Signed)
Pt contacted office for refill request on the following medications:  oxyCODONE-acetaminophen (PERCOCET/ROXICET) 5-325 MG tablet.  EA#[phone]/NWCB#[phone]/MW

## 2015-11-12 NOTE — Telephone Encounter (Signed)
Scrip available to pickup at the front

## 2015-12-11 ENCOUNTER — Other Ambulatory Visit: Payer: Self-pay | Admitting: Family Medicine

## 2015-12-11 ENCOUNTER — Telehealth: Payer: Self-pay | Admitting: Family Medicine

## 2015-12-11 DIAGNOSIS — M5442 Lumbago with sciatica, left side: Secondary | ICD-10-CM

## 2015-12-11 MED ORDER — OXYCODONE-ACETAMINOPHEN 5-325 MG PO TABS: 1.0000 | ORAL_TABLET | Freq: Four times a day (QID) | ORAL | 0 refills | Status: DC | PRN

## 2015-12-11 NOTE — Telephone Encounter (Signed)
Patient has been advised. KW 

## 2015-12-11 NOTE — Telephone Encounter (Signed)
rx up front for pick up 

## 2015-12-11 NOTE — Telephone Encounter (Signed)
Pt contacted office for refill request on the following medications:  OXYCODONE-ACETAMINOPHEN 5-325.  LK#[phone]/VX

## 2015-12-11 NOTE — Telephone Encounter (Signed)
Refill request. KW 

## 2015-12-14 NOTE — Telephone Encounter (Signed)
error 

## 2016-01-13 ENCOUNTER — Other Ambulatory Visit: Payer: Self-pay | Admitting: Family Medicine

## 2016-01-13 DIAGNOSIS — M5442 Lumbago with sciatica, left side: Secondary | ICD-10-CM

## 2016-01-13 MED ORDER — OXYCODONE-ACETAMINOPHEN 5-325 MG PO TABS: 1.0000 | ORAL_TABLET | Freq: Four times a day (QID) | ORAL | 0 refills | Status: DC | PRN

## 2016-01-13 NOTE — Telephone Encounter (Signed)
Pt called needing refill on his oxycodone   Please call when ready to pick up [phone]  Thanks teri

## 2016-01-13 NOTE — Telephone Encounter (Signed)
Pt advised. Ekam Bonebrake Drozdowski, CMA  

## 2016-01-13 NOTE — Telephone Encounter (Signed)
rx available for pickup 

## 2016-02-01 ENCOUNTER — Ambulatory Visit: Payer: Self-pay | Admitting: Family Medicine

## 2016-02-05 ENCOUNTER — Other Ambulatory Visit: Payer: Self-pay | Admitting: Family Medicine

## 2016-02-05 DIAGNOSIS — I10 Essential (primary) hypertension: Secondary | ICD-10-CM

## 2016-02-05 MED ORDER — LOSARTAN POTASSIUM 100 MG PO TABS: 100.0000 mg | ORAL_TABLET | Freq: Every day | ORAL | 1 refills | Status: DC

## 2016-02-12 ENCOUNTER — Telehealth: Payer: Self-pay | Admitting: Family Medicine

## 2016-02-12 ENCOUNTER — Other Ambulatory Visit: Payer: Self-pay | Admitting: Family Medicine

## 2016-02-12 DIAGNOSIS — G8929 Other chronic pain: Secondary | ICD-10-CM

## 2016-02-12 DIAGNOSIS — M5442 Lumbago with sciatica, left side: Principal | ICD-10-CM

## 2016-02-12 MED ORDER — OXYCODONE-ACETAMINOPHEN 5-325 MG PO TABS: 1.0000 | ORAL_TABLET | Freq: Four times a day (QID) | ORAL | 0 refills | Status: DC | PRN

## 2016-02-12 NOTE — Telephone Encounter (Signed)
Refill request

## 2016-02-12 NOTE — Telephone Encounter (Signed)
Pt needs refill on his oxycodone 5-325  Please call when ready [phone]  Thank sTeri

## 2016-02-12 NOTE — Telephone Encounter (Signed)
Patient has been advised. KW 

## 2016-02-12 NOTE — Telephone Encounter (Signed)
Rx ready for pickup. Schedule an office visit before next refill.

## 2016-02-26 ENCOUNTER — Ambulatory Visit: Payer: Self-pay | Admitting: Family Medicine

## 2016-03-11 ENCOUNTER — Ambulatory Visit (INDEPENDENT_AMBULATORY_CARE_PROVIDER_SITE_OTHER): Payer: Self-pay | Admitting: Family Medicine

## 2016-03-11 ENCOUNTER — Encounter: Payer: Self-pay | Admitting: Family Medicine

## 2016-03-11 VITALS — BP 130/78 | HR 76 | Temp 97.5°F | Resp 16 | Wt 209.0 lb

## 2016-03-11 DIAGNOSIS — M48062 Spinal stenosis, lumbar region with neurogenic claudication: Secondary | ICD-10-CM

## 2016-03-11 DIAGNOSIS — M5442 Lumbago with sciatica, left side: Secondary | ICD-10-CM

## 2016-03-11 DIAGNOSIS — Z125 Encounter for screening for malignant neoplasm of prostate: Secondary | ICD-10-CM

## 2016-03-11 DIAGNOSIS — G8929 Other chronic pain: Secondary | ICD-10-CM

## 2016-03-11 DIAGNOSIS — Z13220 Encounter for screening for lipoid disorders: Secondary | ICD-10-CM

## 2016-03-11 DIAGNOSIS — I10 Essential (primary) hypertension: Secondary | ICD-10-CM

## 2016-03-11 MED ORDER — OXYCODONE-ACETAMINOPHEN 5-325 MG PO TABS: ORAL_TABLET | ORAL | 0 refills | Status: DC

## 2016-03-11 NOTE — Progress Notes (Signed)
Subjective:     Patient ID: Alexander Mcbride, male   DOB: 07/07/1955, 60 y.o.   MRN: [id number]  HPI  Chief Complaint  Patient presents with  . Hypertension    Last FU OV was 01/14/2015. No management changes were made, other than ordering labs. Labs were not drawn. Pt currently taking losartan 100 mg.  . Back Pain    Pt needs refills on Percocet 5-325 mg tablets. Last refill 02/12/2016.  States he is going to get a part time job at [employer] and expects to [employer] medical insurance by January. Currently is limited by disability insurance only. States he plans to return to the pain clinic for his back once he has additional insurance.    Review of Systems  Respiratory: Negative for shortness of breath.   Cardiovascular: Negative for chest pain and palpitations.       States his left foot and ankle will swell after he has been on them for a long time.       Objective:   Physical Exam  Constitutional: He appears well-developed and well-nourished. No distress.  Cardiovascular: Normal rate and regular rhythm.   Pulmonary/Chest: Breath sounds normal.  Musculoskeletal: He exhibits no edema (of lower extremties).       Assessment:    1. Essential (primary) hypertension: continue losartan - Comprehensive metabolic panel  2. Spinal stenosis, lumbar region, with neurogenic claudication - oxyCODONE-acetaminophen (PERCOCET/ROXICET) 5-325 MG tablet; One pill 4 x day as needed for back pain  Dispense: 120 tablet; Refill: 0  3. Chronic left-sided low back pain with left-sided sciatica - oxyCODONE-acetaminophen (PERCOCET/ROXICET) 5-325 MG tablet; One pill 4 x day as needed for back pain  Dispense: 120 tablet; Refill: 0  4. Screening for cholesterol level - Lipid panel  5. Screening for prostate cancer - PSA    Plan:    Further f/u pending lab work.

## 2016-03-11 NOTE — Patient Instructions (Signed)
We will call you with the lab results. 

## 2016-04-12 ENCOUNTER — Other Ambulatory Visit: Payer: Self-pay | Admitting: Family Medicine

## 2016-04-12 NOTE — Telephone Encounter (Signed)
Pt contacted office for refill request on the following medications:  oxyCODONE-acetaminophen (PERCOCET/ROXICET) 5-325 MG tablet.  ZO#[phone]/WJCB#[phone]/MW

## 2016-04-12 NOTE — Telephone Encounter (Signed)
Please review and advise. KW 

## 2016-04-13 ENCOUNTER — Other Ambulatory Visit: Payer: Self-pay | Admitting: Family Medicine

## 2016-04-13 DIAGNOSIS — M5442 Lumbago with sciatica, left side: Principal | ICD-10-CM

## 2016-04-13 DIAGNOSIS — G8929 Other chronic pain: Secondary | ICD-10-CM

## 2016-04-13 DIAGNOSIS — M48062 Spinal stenosis, lumbar region with neurogenic claudication: Secondary | ICD-10-CM

## 2016-04-13 MED ORDER — OXYCODONE-ACETAMINOPHEN 5-325 MG PO TABS: ORAL_TABLET | ORAL | 0 refills | Status: DC

## 2016-04-13 NOTE — Telephone Encounter (Signed)
Left message for patient notifying him that prescription is available for pick up. KW

## 2016-04-13 NOTE — Telephone Encounter (Signed)
rx available for pickup 

## 2016-04-27 DIAGNOSIS — Z0000 Encounter for general adult medical examination without abnormal findings: Secondary | ICD-10-CM

## 2016-04-28 ENCOUNTER — Telehealth: Payer: Self-pay

## 2016-04-28 LAB — LIPID PANEL
CHOL/HDL RATIO: 4.5 ratio (ref 0.0–5.0)
Cholesterol, Total: 214 mg/dL — ABNORMAL HIGH (ref 100–199)
HDL: 48 mg/dL (ref >39)
LDL Calculated: 136 mg/dL — ABNORMAL HIGH (ref 0–99)
Triglycerides: 150 mg/dL — ABNORMAL HIGH (ref 0–149)
VLDL Cholesterol Cal: 30 mg/dL (ref 5–40)

## 2016-04-28 LAB — COMPREHENSIVE METABOLIC PANEL
A/G RATIO: 2 (ref 1.2–2.2)
ALT: 21 IU/L (ref 0–44)
AST: 20 IU/L (ref 0–40)
Albumin: 5.2 g/dL (ref 3.5–5.5)
Alkaline Phosphatase: 92 IU/L (ref 39–117)
BUN/Creatinine Ratio: 15 (ref 9–20)
BUN: 17 mg/dL (ref 6–24)
Bilirubin Total: 1.7 mg/dL — ABNORMAL HIGH (ref 0.0–1.2)
CALCIUM: 9.7 mg/dL (ref 8.7–10.2)
CO2: 21 mmol/L (ref 18–29)
Chloride: 98 mmol/L (ref 96–106)
Creatinine, Ser: 1.16 mg/dL (ref 0.76–1.27)
GFR, EST AFRICAN AMERICAN: 80 mL/min/{1.73_m2} (ref >59)
GFR, EST NON AFRICAN AMERICAN: 69 mL/min/{1.73_m2} (ref >59)
GLOBULIN, TOTAL: 2.6 g/dL (ref 1.5–4.5)
Glucose: 110 mg/dL — ABNORMAL HIGH (ref 65–99)
POTASSIUM: 4.7 mmol/L (ref 3.5–5.2)
SODIUM: 138 mmol/L (ref 134–144)
Total Protein: 7.8 g/dL (ref 6.0–8.5)

## 2016-04-28 NOTE — Telephone Encounter (Signed)
Patient advised as below. Patient reports he will call back tomorrow to let us know if he is willing to start medication. sd

## 2016-04-28 NOTE — Telephone Encounter (Signed)
-----   Message from Anola Gurneyobert Chauvin, [place] sent at 04/28/2016  7:33 AM EST ----- Your sugar is mildly elevated and one of your liver tests. Prostate test result was not available. Cholesterol is also mildly elevated. Your calculated 10 year risk for developing cardiovascular disease is 9.6%. We usually recommend a cholesterol lowering drug at 7.5% risk. If your mother or father have heart disease would strongly recommend starting a statin drug. Do you wish to proceed?  Would recheck liver test in January.

## 2016-05-13 ENCOUNTER — Other Ambulatory Visit: Payer: Self-pay | Admitting: Family Medicine

## 2016-05-13 DIAGNOSIS — G8929 Other chronic pain: Secondary | ICD-10-CM

## 2016-05-13 DIAGNOSIS — M48062 Spinal stenosis, lumbar region with neurogenic claudication: Secondary | ICD-10-CM

## 2016-05-13 DIAGNOSIS — M5442 Lumbago with sciatica, left side: Principal | ICD-10-CM

## 2016-05-13 MED ORDER — OXYCODONE-ACETAMINOPHEN 5-325 MG PO TABS: ORAL_TABLET | ORAL | 0 refills | Status: DC

## 2016-05-13 NOTE — Telephone Encounter (Signed)
Advised pt rx is ready for PU. Allene DillonEmily Drozdowski, CMA

## 2016-05-13 NOTE — Telephone Encounter (Signed)
Pt is requesting a refill of oxyCODONE-acetaminophen (PERCOCET/ROXICET) 5-325 MG tablet to pick up on Monday, January 8th.

## 2016-05-13 NOTE — Telephone Encounter (Signed)
Last refill 04/13/2016. LOV 03/11/2016. Allene DillonEmily Drozdowski, CMA

## 2016-06-13 ENCOUNTER — Other Ambulatory Visit: Payer: Self-pay | Admitting: Family Medicine

## 2016-06-13 ENCOUNTER — Telehealth: Payer: Self-pay | Admitting: Family Medicine

## 2016-06-13 DIAGNOSIS — M48062 Spinal stenosis, lumbar region with neurogenic claudication: Secondary | ICD-10-CM

## 2016-06-13 DIAGNOSIS — G8929 Other chronic pain: Secondary | ICD-10-CM

## 2016-06-13 DIAGNOSIS — M5442 Lumbago with sciatica, left side: Principal | ICD-10-CM

## 2016-06-13 MED ORDER — OXYCODONE-ACETAMINOPHEN 5-325 MG PO TABS: ORAL_TABLET | ORAL | 0 refills | Status: DC

## 2016-06-13 NOTE — Telephone Encounter (Signed)
Last filled 05/13/16, please review. KW

## 2016-06-13 NOTE — Telephone Encounter (Signed)
Please review. Emily Drozdowski, CMA  

## 2016-06-13 NOTE — Telephone Encounter (Signed)
Pt is requesting a referral to see a doctor with pain management.  WJ#[phone]/OZCB#[phone]/MW

## 2016-06-13 NOTE — Telephone Encounter (Signed)
Pt advised. Alexander Mcbride, CMA  

## 2016-06-13 NOTE — Telephone Encounter (Signed)
Only can prescribe 30 day supply at a time. Rx available for pickup

## 2016-06-13 NOTE — Telephone Encounter (Signed)
Pt contacted office for refill request on the following medications: CB#[phone]/MW  oxyCODONE-acetaminophen (PERCOCET/ROXICET) 5-325 MG tablet.  90 day supply.  [facility] [address]  oxyCODONE-acetaminophen (PERCOCET/ROXICET) 5-325 MG tablet

## 2016-06-13 NOTE — Telephone Encounter (Signed)
Referral in progress. 

## 2016-06-15 ENCOUNTER — Other Ambulatory Visit: Payer: Self-pay | Admitting: Family Medicine

## 2016-06-15 DIAGNOSIS — I10 Essential (primary) hypertension: Secondary | ICD-10-CM

## 2016-07-12 ENCOUNTER — Other Ambulatory Visit: Payer: Self-pay

## 2016-07-12 ENCOUNTER — Other Ambulatory Visit: Payer: Self-pay | Admitting: Family Medicine

## 2016-07-12 DIAGNOSIS — G8929 Other chronic pain: Secondary | ICD-10-CM

## 2016-07-12 DIAGNOSIS — M48062 Spinal stenosis, lumbar region with neurogenic claudication: Secondary | ICD-10-CM

## 2016-07-12 DIAGNOSIS — M5442 Lumbago with sciatica, left side: Principal | ICD-10-CM

## 2016-07-12 MED ORDER — OXYCODONE-ACETAMINOPHEN 5-325 MG PO TABS: ORAL_TABLET | ORAL | 0 refills | Status: DC

## 2016-07-12 NOTE — Telephone Encounter (Signed)
Last refill 06/13/2016. Pt is concerned because he has not heard back regarding his pain referral. Was referred to Dr. Shauna HughNaveria, but the provider is not taking new pts. Pt reports he does not want to drive to [place]. Please advise. Allene DillonEmily Drozdowski, CMA

## 2016-07-12 NOTE — Telephone Encounter (Signed)
I have refilled his medication but I need you to see if he has any other local options for pain management.

## 2016-08-15 ENCOUNTER — Other Ambulatory Visit: Payer: Self-pay | Admitting: Family Medicine

## 2016-08-15 DIAGNOSIS — G8929 Other chronic pain: Secondary | ICD-10-CM

## 2016-08-15 DIAGNOSIS — M5442 Lumbago with sciatica, left side: Principal | ICD-10-CM

## 2016-08-15 DIAGNOSIS — M48062 Spinal stenosis, lumbar region with neurogenic claudication: Secondary | ICD-10-CM

## 2016-08-15 MED ORDER — OXYCODONE-ACETAMINOPHEN 5-325 MG PO TABS: ORAL_TABLET | ORAL | 0 refills | Status: DC

## 2016-08-15 NOTE — Telephone Encounter (Signed)
Pt needs refill  oxyCODONE-acetaminophen (PERCOCET/ROXICET) 5-325 MG tablet   07/12/16 -- Anola Gurney, PA    One pill 4 x day as needed for back pain   Thank s Barth Kirks

## 2016-08-23 ENCOUNTER — Ambulatory Visit (INDEPENDENT_AMBULATORY_CARE_PROVIDER_SITE_OTHER): Payer: Self-pay | Admitting: Family Medicine

## 2016-08-23 ENCOUNTER — Encounter: Payer: Self-pay | Admitting: Family Medicine

## 2016-08-23 VITALS — BP 142/80 | HR 66 | Temp 98.0°F | Resp 16 | Wt 199.0 lb

## 2016-08-23 DIAGNOSIS — Z23 Encounter for immunization: Secondary | ICD-10-CM

## 2016-08-23 DIAGNOSIS — E782 Mixed hyperlipidemia: Secondary | ICD-10-CM

## 2016-08-23 DIAGNOSIS — G8929 Other chronic pain: Secondary | ICD-10-CM

## 2016-08-23 DIAGNOSIS — M79672 Pain in left foot: Secondary | ICD-10-CM

## 2016-08-23 DIAGNOSIS — Z125 Encounter for screening for malignant neoplasm of prostate: Secondary | ICD-10-CM

## 2016-08-23 DIAGNOSIS — I10 Essential (primary) hypertension: Secondary | ICD-10-CM

## 2016-08-23 NOTE — Patient Instructions (Signed)
We will call you with the lab results. Come in for a nurse bp check on the day you have the labs drawn.
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
## 2016-08-24 ENCOUNTER — Telehealth: Payer: Self-pay | Admitting: Family Medicine

## 2016-08-24 NOTE — Telephone Encounter (Signed)
Pt called saying he was in yesterday and got a tetanus shot.  He started throwing up last night but has stopped this am.  He wants  To know if it could be a side effect from the td.  Please advise

## 2016-08-24 NOTE — Telephone Encounter (Signed)
Patient advised that nausea and vomiting is a mild side effect from Tdap vaccine, according to [facility] handout 1out of10 adults experience this. Patient states that symptoms have subsided, he was advised to continue to monitor symptoms throughout the day and if they return to call office back. KW

## 2016-09-19 ENCOUNTER — Telehealth: Payer: Self-pay | Admitting: Family Medicine

## 2016-09-19 ENCOUNTER — Other Ambulatory Visit: Payer: Self-pay | Admitting: Family Medicine

## 2016-09-19 DIAGNOSIS — M48062 Spinal stenosis, lumbar region with neurogenic claudication: Secondary | ICD-10-CM

## 2016-09-19 DIAGNOSIS — M5442 Lumbago with sciatica, left side: Principal | ICD-10-CM

## 2016-09-19 DIAGNOSIS — G8929 Other chronic pain: Secondary | ICD-10-CM

## 2016-09-19 MED ORDER — OXYCODONE-ACETAMINOPHEN 5-325 MG PO TABS: ORAL_TABLET | ORAL | 0 refills | Status: DC

## 2016-09-19 NOTE — Telephone Encounter (Signed)
Please review. KW 

## 2016-09-19 NOTE — Telephone Encounter (Signed)
Oxycodone rx available for pickup.

## 2016-09-19 NOTE — Telephone Encounter (Signed)
Pt contacted office for refill request on the following medications:  oxyCODONE-acetaminophen (PERCOCET/ROXICET) 5-325 MG tablet.  CB#[phone]/MW ° °

## 2016-09-19 NOTE — Telephone Encounter (Signed)
Patient has been notified. KW 

## 2016-10-17 ENCOUNTER — Other Ambulatory Visit: Payer: Self-pay | Admitting: Family Medicine

## 2016-10-17 DIAGNOSIS — M48062 Spinal stenosis, lumbar region with neurogenic claudication: Secondary | ICD-10-CM

## 2016-10-17 DIAGNOSIS — G8929 Other chronic pain: Secondary | ICD-10-CM

## 2016-10-17 DIAGNOSIS — M5442 Lumbago with sciatica, left side: Principal | ICD-10-CM

## 2016-10-17 MED ORDER — OXYCODONE-ACETAMINOPHEN 5-325 MG PO TABS: ORAL_TABLET | ORAL | 0 refills | Status: DC

## 2016-10-17 NOTE — Telephone Encounter (Signed)
Pt contacted office for refill request on the following medications:  oxyCODONE-acetaminophen (PERCOCET/ROXICET) 5-325 MG tablet.  CB#[phone]/MW ° °

## 2016-10-17 NOTE — Telephone Encounter (Signed)
Oxycodone available for pickup. May Fill 6/14.

## 2016-10-17 NOTE — Telephone Encounter (Signed)
Last filled 09/19/16, please review. KW

## 2016-10-17 NOTE — Telephone Encounter (Signed)
Patient was advised. KW 

## 2016-11-21 ENCOUNTER — Other Ambulatory Visit: Payer: Self-pay | Admitting: Family Medicine

## 2016-11-21 DIAGNOSIS — G8929 Other chronic pain: Secondary | ICD-10-CM

## 2016-11-21 DIAGNOSIS — M48062 Spinal stenosis, lumbar region with neurogenic claudication: Secondary | ICD-10-CM

## 2016-11-21 DIAGNOSIS — M5442 Lumbago with sciatica, left side: Principal | ICD-10-CM

## 2016-11-21 MED ORDER — OXYCODONE-ACETAMINOPHEN 5-325 MG PO TABS: ORAL_TABLET | ORAL | 0 refills | Status: DC

## 2016-11-21 NOTE — Telephone Encounter (Signed)
Pt contacted office for refill request on the following medications:  oxyCODONE-acetaminophen (PERCOCET/ROXICET) 5-325 MG.  JX#[phone]/ZHCB#[phone]/MW

## 2017-01-04 ENCOUNTER — Encounter: Payer: Self-pay | Admitting: Family Medicine

## 2017-01-04 ENCOUNTER — Other Ambulatory Visit: Payer: Self-pay | Admitting: Family Medicine

## 2017-01-04 ENCOUNTER — Ambulatory Visit (INDEPENDENT_AMBULATORY_CARE_PROVIDER_SITE_OTHER): Payer: Self-pay | Admitting: Family Medicine

## 2017-01-04 VITALS — BP 144/84 | HR 74 | Temp 98.6°F | Resp 16 | Wt 192.2 lb

## 2017-01-04 DIAGNOSIS — G8929 Other chronic pain: Secondary | ICD-10-CM

## 2017-01-04 DIAGNOSIS — I10 Essential (primary) hypertension: Secondary | ICD-10-CM

## 2017-01-04 DIAGNOSIS — M48062 Spinal stenosis, lumbar region with neurogenic claudication: Secondary | ICD-10-CM

## 2017-01-04 DIAGNOSIS — M5442 Lumbago with sciatica, left side: Secondary | ICD-10-CM

## 2017-01-04 DIAGNOSIS — M79672 Pain in left foot: Secondary | ICD-10-CM

## 2017-01-04 MED ORDER — OXYCODONE-ACETAMINOPHEN 5-325 MG PO TABS: ORAL_TABLET | ORAL | 0 refills | Status: DC

## 2017-01-04 NOTE — Patient Instructions (Addendum)
Do come in for a nurse bp check when you have not eaten salty foods. Maralyn Sago will call you with the podiatry referral.

## 2017-01-04 NOTE — Progress Notes (Signed)
Subjective:     Patient ID: Alexander Mcbride, male   DOB: January 28, 1956, 61 y.o.   MRN: [id number]  HPI  Chief Complaint  Patient presents with  . Hypertension    Patient returns to office today for follow up, last office visit was 08/23/16 blood pressure in house was 142/80. Patient reports good compliance and tolerance on medication  . Back Pain    Follow up from 03/11/16, patient reports good compliance and tolerance on Percocet 5-325mg .   Marland Kitchen Foot Pain    Patient complains of nerve pain in his left foot for the past 30 days.   States he had salty foods this AM for breakfast. Continues to work at [employer] 30-39 hours/week which aggravates the bottom of his foot. Does not have insurance yet as he is not full time. Has had prior left foot surgery per Dr.Cline. Back pain controlled with 2-4 oxycodone daily. Continues to see a "nutritiionist" who has given him a sleep preparation. Did not get labs from prior o.v.but does not wish to update at this time   Review of Systems  Respiratory: Negative for shortness of breath.   Cardiovascular: Negative for chest pain and palpitations.       Objective:   Physical Exam  Constitutional: He appears well-developed and well-nourished. No distress.  Cardiovascular: Normal rate and regular rhythm.   Pulses:      Dorsalis pedis pulses are 2+ on the left side.       Posterior tibial pulses are 2+ on the left side.  Pulmonary/Chest: Breath sounds normal.  Musculoskeletal: He exhibits no edema (of lower extremities).  Mid plantar left foot with approx. 2 cm tender subcutaneous nodule.       Assessment:    1. Essential (primary) hypertension  2. Chronic left-sided low back pain with left-sided sciatica - oxyCODONE-acetaminophen (PERCOCET/ROXICET) 5-325 MG tablet; One pill 4 x day as needed for back pain  Dispense: 120 tablet; Refill: 0  3. Chronic foot pain, left - Ambulatory referral to Podiatry  4. Spinal stenosis, lumbar region, with neurogenic  claudication - oxyCODONE-acetaminophen (PERCOCET/ROXICET) 5-325 MG tablet; One pill 4 x day as needed for back pain  Dispense: 120 tablet; Refill: 0    Plan:    F/u in 3 months. Nurse bp check when off salty food. Consider adding low dose diuretic to losartan

## 2017-02-07 ENCOUNTER — Telehealth: Payer: Self-pay

## 2017-02-07 DIAGNOSIS — G8929 Other chronic pain: Secondary | ICD-10-CM

## 2017-02-07 DIAGNOSIS — M5442 Lumbago with sciatica, left side: Principal | ICD-10-CM

## 2017-02-07 DIAGNOSIS — M48062 Spinal stenosis, lumbar region with neurogenic claudication: Secondary | ICD-10-CM

## 2017-02-07 MED ORDER — OXYCODONE-ACETAMINOPHEN 5-325 MG PO TABS: 1.0000 | ORAL_TABLET | Freq: Four times a day (QID) | ORAL | 0 refills | Status: DC | PRN

## 2017-02-07 NOTE — Telephone Encounter (Signed)
Patient has called office requesting refill on Oxycodone-Acetaminophen 5-325mg . Patient was advised that Nadine Counts is out of the office this week. KW

## 2017-03-14 ENCOUNTER — Other Ambulatory Visit: Payer: Self-pay | Admitting: Family Medicine

## 2017-03-14 DIAGNOSIS — I10 Essential (primary) hypertension: Secondary | ICD-10-CM

## 2017-03-17 ENCOUNTER — Telehealth: Payer: Self-pay | Admitting: Family Medicine

## 2017-03-17 ENCOUNTER — Other Ambulatory Visit: Payer: Self-pay | Admitting: Family Medicine

## 2017-03-17 DIAGNOSIS — G8929 Other chronic pain: Secondary | ICD-10-CM

## 2017-03-17 DIAGNOSIS — M5442 Lumbago with sciatica, left side: Principal | ICD-10-CM

## 2017-03-17 DIAGNOSIS — M48062 Spinal stenosis, lumbar region with neurogenic claudication: Secondary | ICD-10-CM

## 2017-03-17 MED ORDER — OXYCODONE-ACETAMINOPHEN 5-325 MG PO TABS: 1.0000 | ORAL_TABLET | Freq: Four times a day (QID) | ORAL | 0 refills | Status: DC | PRN

## 2017-03-17 NOTE — Telephone Encounter (Signed)
Pt needing refill of oxycodone 5-325.  Pt states he will be out of medication by Tuesday.  Pt states he is not able to get into pain management b/c of his work schedule and he had now misplaced the phone number for them.  He is requesting a refill until he can get into Pain Mgmt and have the number of where he was referred to.

## 2017-03-17 NOTE — Telephone Encounter (Signed)
Prescription for pain medication up front for pickup.

## 2017-03-17 NOTE — Telephone Encounter (Signed)
Patient advised.KW 

## 2017-03-17 NOTE — Telephone Encounter (Signed)
Please review. KW 

## 2017-04-19 ENCOUNTER — Ambulatory Visit: Payer: Self-pay | Admitting: Family Medicine

## 2017-04-19 ENCOUNTER — Encounter: Payer: Self-pay | Admitting: Family Medicine

## 2017-04-19 VITALS — BP 150/86 | HR 72 | Temp 98.8°F | Resp 16 | Wt 188.6 lb

## 2017-04-19 DIAGNOSIS — M48062 Spinal stenosis, lumbar region with neurogenic claudication: Secondary | ICD-10-CM

## 2017-04-19 DIAGNOSIS — M5442 Lumbago with sciatica, left side: Secondary | ICD-10-CM

## 2017-04-19 DIAGNOSIS — K645 Perianal venous thrombosis: Secondary | ICD-10-CM

## 2017-04-19 DIAGNOSIS — G8929 Other chronic pain: Secondary | ICD-10-CM

## 2017-04-19 MED ORDER — HYDROCORTISONE ACE-PRAMOXINE 2.5-1 % RE CREA: 1.0000 "application " | TOPICAL_CREAM | Freq: Three times a day (TID) | RECTAL | 1 refills | Status: DC

## 2017-04-19 MED ORDER — OXYCODONE-ACETAMINOPHEN 5-325 MG PO TABS: 1.0000 | ORAL_TABLET | Freq: Four times a day (QID) | ORAL | 0 refills | Status: DC | PRN

## 2017-04-19 NOTE — Progress Notes (Signed)
Subjective:     Patient ID: Alexander Mcbride, male   DOB: [birth year], 61 y.o.   MRN: [id number] Chief Complaint  Patient presents with  . Rectal Pain   HPI States he strained during a bowel movement about 1-2 weeks ago. Now has pain with bowel movements and a small amount of BRB on toilet tissue. Also wishes refill on his pain medication. He [facility]. Currently working full time at [employer].  Review of Systems     Objective:   Physical Exam  Constitutional: He appears well-developed and well-nourished. No distress.  Genitourinary:  Genitourinary Comments: Appears to have an external hemorrhoid with irritated appearance. Moderately tender to the touch.       Assessment:    1. External hemorrhoid, thrombosed - hydrocortisone-pramoxine (ANALPRAM HC) 2.5-1 % rectal cream; Place 1 application rectally 3 (three) times daily.  Dispense: 30 g; Refill: 1  2. Spinal stenosis, lumbar region, with neurogenic claudication - oxyCODONE-acetaminophen (PERCOCET/ROXICET) 5-325 MG tablet; Take 1 tablet by mouth 4 (four) times daily as needed (back pain).  Dispense: 120 tablet; Refill: 0     Plan:    Discussed bathtub soaks. Provided with number for the [facility] pain clinic.

## 2017-04-19 NOTE — Patient Instructions (Signed)
Try hot bathtub soaks daily for your hemorrhoid. Let me know if not improving.

## 2017-04-20 ENCOUNTER — Ambulatory Visit: Payer: Self-pay | Admitting: Family Medicine

## 2017-04-20 ENCOUNTER — Encounter: Payer: Self-pay | Admitting: Family Medicine

## 2017-04-20 VITALS — BP 160/80 | HR 68 | Temp 98.2°F | Resp 16

## 2017-04-20 DIAGNOSIS — K1370 Unspecified lesions of oral mucosa: Secondary | ICD-10-CM

## 2017-04-20 NOTE — Patient Instructions (Signed)
Discussed use of salt water/peroxide rinses (1/2 teaspoon of salt in warm water with dash of hydrogen peroxide).  Call if not improving.

## 2017-04-20 NOTE — Progress Notes (Signed)
Subjective:     Patient ID: Alexander Mcbride, male   DOB: [birth year], 61 y.o.   MRN: [id number] Chief Complaint  Patient presents with  . Oral Pain    Patient comes into office today with concerns of a possible blister on the left side of his cheek, patient states that he bit his mouth while eating a few days ago but just had sorness in his mouth.    HPI States he has implants and tends to bite the inside of his mouth if not careful. Concerned that he can see and feel a blister in his mouth. States it was not there previously.  Review of Systems     Objective:   Physical Exam  Constitutional: He appears well-developed and well-nourished. No distress.  HENT:  Left buccal lesion c/w hemorrhagic vesicle.       Assessment:    1. Oral mucosal lesion: suspect hemorrhagic vesicle due to trauma    Plan:    Discussed use of warm saline/hydrogen peroxide rinses.

## 2017-05-16 ENCOUNTER — Other Ambulatory Visit: Payer: Self-pay | Admitting: Family Medicine

## 2017-05-16 ENCOUNTER — Telehealth: Payer: Self-pay | Admitting: Family Medicine

## 2017-05-16 DIAGNOSIS — M5442 Lumbago with sciatica, left side: Principal | ICD-10-CM

## 2017-05-16 DIAGNOSIS — M48062 Spinal stenosis, lumbar region with neurogenic claudication: Secondary | ICD-10-CM

## 2017-05-16 DIAGNOSIS — G8929 Other chronic pain: Secondary | ICD-10-CM

## 2017-05-16 MED ORDER — OXYCODONE-ACETAMINOPHEN 5-325 MG PO TABS: 1.0000 | ORAL_TABLET | Freq: Four times a day (QID) | ORAL | 0 refills | Status: DC | PRN

## 2017-05-16 NOTE — Telephone Encounter (Signed)
Patient advised.KW 

## 2017-05-16 NOTE — Telephone Encounter (Signed)
Last filled 04/19/17.KW

## 2017-05-16 NOTE — Telephone Encounter (Signed)
Rx up front for pickup. Can fill on 05/19/17.

## 2017-05-16 NOTE — Telephone Encounter (Signed)
Patient is requesting a refill for the following medications:  oxyCODONE-acetaminophen (PERCOCET/ROXICET) 5-325 MG tablet  [facility] [address]

## 2017-05-17 ENCOUNTER — Other Ambulatory Visit: Payer: Self-pay | Admitting: Family Medicine

## 2017-05-17 MED ORDER — HYDROCHLOROTHIAZIDE 25 MG PO TABS: 25.0000 mg | ORAL_TABLET | Freq: Every day | ORAL | 0 refills | Status: DC

## 2017-05-30 ENCOUNTER — Telehealth: Payer: Self-pay

## 2017-05-30 NOTE — Telephone Encounter (Signed)
Patient walked in office today for nurse blood pressure check, he states that he was feeling well today. Patient reports good compliance on medication and reports that he has bene having symptom of nausea when taking medication. Patients blood pressure in office today was 150/74. KW

## 2017-05-30 NOTE — Telephone Encounter (Signed)
States he has been on HCTZ for a week. Suggested he take medication with food and recheck in one week.

## 2017-06-14 ENCOUNTER — Other Ambulatory Visit: Payer: Self-pay | Admitting: Family Medicine

## 2017-06-26 ENCOUNTER — Telehealth: Payer: Self-pay | Admitting: Family Medicine

## 2017-06-26 DIAGNOSIS — M48062 Spinal stenosis, lumbar region with neurogenic claudication: Secondary | ICD-10-CM

## 2017-06-26 DIAGNOSIS — G8929 Other chronic pain: Secondary | ICD-10-CM

## 2017-06-26 DIAGNOSIS — M5442 Lumbago with sciatica, left side: Principal | ICD-10-CM

## 2017-06-26 MED ORDER — OXYCODONE-ACETAMINOPHEN 5-325 MG PO TABS: 1.0000 | ORAL_TABLET | Freq: Four times a day (QID) | ORAL | 0 refills | Status: DC | PRN

## 2017-06-26 NOTE — Telephone Encounter (Signed)
Pt requesting refill of oxycodone 5-325.  States he will be out in a few days.

## 2017-07-15 ENCOUNTER — Other Ambulatory Visit: Payer: Self-pay | Admitting: Family Medicine

## 2017-07-29 ENCOUNTER — Other Ambulatory Visit: Payer: Self-pay | Admitting: Family Medicine

## 2017-07-29 DIAGNOSIS — I10 Essential (primary) hypertension: Secondary | ICD-10-CM

## 2017-08-02 ENCOUNTER — Other Ambulatory Visit: Payer: Self-pay

## 2017-08-02 DIAGNOSIS — G8929 Other chronic pain: Secondary | ICD-10-CM

## 2017-08-02 DIAGNOSIS — M48062 Spinal stenosis, lumbar region with neurogenic claudication: Secondary | ICD-10-CM

## 2017-08-02 DIAGNOSIS — M5442 Lumbago with sciatica, left side: Principal | ICD-10-CM

## 2017-08-02 NOTE — Telephone Encounter (Signed)
Patient is requesting a refill on [facility]xycodone [facility]  [phone]

## 2017-08-03 ENCOUNTER — Other Ambulatory Visit: Payer: Self-pay | Admitting: Family Medicine

## 2017-08-03 ENCOUNTER — Telehealth: Payer: Self-pay

## 2017-08-03 DIAGNOSIS — G8929 Other chronic pain: Secondary | ICD-10-CM

## 2017-08-03 DIAGNOSIS — M5442 Lumbago with sciatica, left side: Principal | ICD-10-CM

## 2017-08-03 DIAGNOSIS — M48062 Spinal stenosis, lumbar region with neurogenic claudication: Secondary | ICD-10-CM

## 2017-08-03 MED ORDER — OXYCODONE-ACETAMINOPHEN 5-325 MG PO TABS: 1.0000 | ORAL_TABLET | Freq: Four times a day (QID) | ORAL | 0 refills | Status: DC | PRN

## 2017-08-03 NOTE — Telephone Encounter (Signed)
-----   Message from Anola Gurneyobert Chauvin, [place] sent at 08/03/2017 11:49 AM EDT ----- Let him know I sent in his oxycodone electronically

## 2017-08-03 NOTE — Telephone Encounter (Signed)
Left detailed message for patient advising him of prescription. KW

## 2017-09-06 ENCOUNTER — Telehealth: Payer: Self-pay | Admitting: Family Medicine

## 2017-09-06 ENCOUNTER — Other Ambulatory Visit: Payer: Self-pay | Admitting: Family Medicine

## 2017-09-06 DIAGNOSIS — M48062 Spinal stenosis, lumbar region with neurogenic claudication: Secondary | ICD-10-CM

## 2017-09-06 DIAGNOSIS — G8929 Other chronic pain: Secondary | ICD-10-CM

## 2017-09-06 DIAGNOSIS — M5442 Lumbago with sciatica, left side: Principal | ICD-10-CM

## 2017-09-06 MED ORDER — OXYCODONE-ACETAMINOPHEN 5-325 MG PO TABS: 1.0000 | ORAL_TABLET | Freq: Four times a day (QID) | ORAL | 0 refills | Status: DC | PRN

## 2017-09-06 NOTE — Telephone Encounter (Signed)
Pt requesting refill of oxycodone 5-325 [facility] [address]

## 2017-09-06 NOTE — Telephone Encounter (Signed)
done

## 2017-09-20 ENCOUNTER — Telehealth: Payer: Self-pay

## 2017-09-20 ENCOUNTER — Encounter: Payer: Self-pay | Admitting: Family Medicine

## 2017-09-20 ENCOUNTER — Ambulatory Visit: Payer: Self-pay | Admitting: Family Medicine

## 2017-09-20 VITALS — BP 132/76 | HR 69 | Temp 98.4°F | Resp 16 | Wt 186.4 lb

## 2017-09-20 DIAGNOSIS — Z720 Tobacco use: Secondary | ICD-10-CM

## 2017-09-20 DIAGNOSIS — R300 Dysuria: Secondary | ICD-10-CM

## 2017-09-20 LAB — POCT URINALYSIS DIPSTICK
GLUCOSE UA: NEGATIVE
PROTEIN UA: NEGATIVE
Spec Grav, UA: 1.01 (ref 1.010–1.025)
pH, UA: 6.5 (ref 5.0–8.0)

## 2017-09-20 MED ORDER — VARENICLINE TARTRATE 0.5 MG X 11 & 1 MG X 42 PO MISC: ORAL | 0 refills | Status: DC

## 2017-09-20 MED ORDER — CEPHALEXIN 500 MG PO CAPS: 500.0000 mg | ORAL_CAPSULE | Freq: Two times a day (BID) | ORAL | 0 refills | Status: DC

## 2017-09-20 NOTE — Patient Instructions (Signed)
We will call you with the urine culture results. 

## 2017-09-20 NOTE — Progress Notes (Signed)
  Subjective:     Patient ID: Alexander Mcbride, male   DOB: 1956/04/07, 62 y.o.   MRN: [id number] Chief Complaint  Patient presents with  . Dysuria    Patient comes in office today with complaints of frequency, dysuria and pelvic pressure for the past 3 days. Patient states that he has been taking otc Azo for the past 2 days.    HPI States AZO has improved but not resolved his sx. Has previous hx of UTI but no diverticulitis per his report. Currently in a long term relationship. States sx were preceded by an episode of anal intercourse. Also wishes to start Chantix again as he has resumed smoking.  Review of Systems     Objective:   Physical Exam  Constitutional: He appears well-developed and well-nourished. No distress.  Abdominal: Soft. Bowel sounds are normal. There is tenderness (mild in LLQ). There is no guarding.  Genitourinary:  Genitourinary Comments: Prostate just felt with tip of examiner's finger-non-tender ? boggy       Assessment:    1. Dysuria: AZ0 prohibits reading of U/A. Start cephalexin empirically. - POCT urinalysis dipstick - Urine Culture  2. Tobacco use: Chantix starter pack    Plan:    Further f/u pending urine culture report.

## 2017-09-20 NOTE — Telephone Encounter (Signed)
Please review and advise. KW 

## 2017-09-20 NOTE — Telephone Encounter (Signed)
Patient called saying that the Chantix will be $400 and his insurance will not cover it. He wanted to know if there is an alternative medication that he can take? Please advise. He is aware that Nadine Counts is out of this office this afternoon. Thanks!

## 2017-09-21 NOTE — Telephone Encounter (Signed)
[id number] 

## 2017-09-21 NOTE — Telephone Encounter (Signed)
Patient advised, card left up front for pick up. KW

## 2017-09-21 NOTE — Telephone Encounter (Signed)
Have him get a Good RX card and compare prices at different pharmacies for the Chantix.

## 2017-09-22 ENCOUNTER — Telehealth: Payer: Self-pay

## 2017-09-22 LAB — URINE CULTURE: ORGANISM ID, BACTERIA: NO GROWTH

## 2017-09-22 NOTE — Telephone Encounter (Signed)
Patient has been advised. KW 

## 2017-09-22 NOTE — Telephone Encounter (Signed)
-----   Message from Anola Gurney, [place] sent at 09/22/2017  7:29 AM EDT ----- No growth on urine culture but continue the antibiotic if helping as the infection may be in your prostate.

## 2017-10-09 ENCOUNTER — Ambulatory Visit: Payer: Self-pay | Admitting: Family Medicine

## 2017-10-09 ENCOUNTER — Encounter: Payer: Self-pay | Admitting: Family Medicine

## 2017-10-09 VITALS — BP 142/90 | HR 71 | Temp 97.8°F | Resp 16 | Wt 189.2 lb

## 2017-10-09 DIAGNOSIS — M48062 Spinal stenosis, lumbar region with neurogenic claudication: Secondary | ICD-10-CM

## 2017-10-09 DIAGNOSIS — R1030 Lower abdominal pain, unspecified: Secondary | ICD-10-CM

## 2017-10-09 DIAGNOSIS — R350 Frequency of micturition: Secondary | ICD-10-CM

## 2017-10-09 LAB — POCT URINALYSIS DIPSTICK
Bilirubin, UA: NEGATIVE
GLUCOSE UA: NEGATIVE
Ketones, UA: NEGATIVE
LEUKOCYTES UA: NEGATIVE
NITRITE UA: NEGATIVE
PROTEIN UA: NEGATIVE
RBC UA: NEGATIVE
Urobilinogen, UA: 0.2 E.U./dL
pH, UA: 5 (ref 5.0–8.0)

## 2017-10-09 MED ORDER — OXYCODONE-ACETAMINOPHEN 5-325 MG PO TABS: 1.0000 | ORAL_TABLET | Freq: Four times a day (QID) | ORAL | 0 refills | Status: DC | PRN

## 2017-10-09 MED ORDER — METRONIDAZOLE 500 MG PO TABS: 500.0000 mg | ORAL_TABLET | Freq: Three times a day (TID) | ORAL | 0 refills | Status: DC

## 2017-10-09 MED ORDER — CIPROFLOXACIN HCL 500 MG PO TABS: 500.0000 mg | ORAL_TABLET | Freq: Two times a day (BID) | ORAL | 0 refills | Status: DC

## 2017-10-09 NOTE — Patient Instructions (Signed)
We will call you with the lab results. 

## 2017-10-09 NOTE — Progress Notes (Signed)
  Subjective:     Patient ID: Alexander Mcbride, male   DOB: [birth year], 62 y.o.   MRN: [id number] Chief Complaint  Patient presents with  . Urinary Frequency    Patient comes in office today with complaints of urinary frequency for the past two days. Patient states that he has presure in his lower abdominal area when voiding. Patient denies dysuria, hematuria, fever or back pain.    HPI States symptoms had improved after a course of cephalexin x 7 days following last o.v. 09/20/17. Additionally reports mild burning with urination and ejaculation. Last PSA in 2014: 0.5.  Review of Systems     Objective:   Physical Exam  Constitutional: He appears well-developed and well-nourished. No distress.  Abdominal: Bowel sounds are normal. Tenderness: mild tenderness primarily in mid-line lower abdomen.       Assessment:    1. Urinary frequency: will cover for UTI and diverticulitis - POCT urinalysis dipstick - PSA - Hemoglobin A1c - ciprofloxacin (CIPRO) 500 MG tablet; Take 1 tablet (500 mg total) by mouth 2 (two) times daily.  Dispense: 14 tablet; Refill: 0 - metroNIDAZOLE (FLAGYL) 500 MG tablet; Take 1 tablet (500 mg total) by mouth 3 (three) times daily.  Dispense: 21 tablet; Refill: 0  2. Spinal stenosis, lumbar region, with neurogenic claudication - oxyCODONE-acetaminophen (PERCOCET/ROXICET) 5-325 MG tablet; Take 1 tablet by mouth 4 (four) times daily as needed (back pain).  Dispense: 120 tablet; Refill: 0  3. Lower abdominal pain: will cover for UTI and diverticulitis. - ciprofloxacin (CIPRO) 500 MG tablet; Take 1 tablet (500 mg total) by mouth 2 (two) times daily.  Dispense: 14 tablet; Refill: 0 - metroNIDAZOLE (FLAGYL) 500 MG tablet; Take 1 tablet (500 mg total) by mouth 3 (three) times daily.  Dispense: 21 tablet; Refill: 0    Plan:    Further f/u pending lab work.

## 2017-10-10 ENCOUNTER — Telehealth: Payer: Self-pay

## 2017-10-10 LAB — HEMOGLOBIN A1C
Est. average glucose Bld gHb Est-mCnc: 111 mg/dL
Hgb A1c MFr Bld: 5.5 % (ref 4.8–5.6)

## 2017-10-10 LAB — PSA: PROSTATE SPECIFIC AG, SERUM: 1.7 ng/mL (ref 0.0–4.0)

## 2017-10-10 NOTE — Telephone Encounter (Signed)
Patient has been advised. KW 

## 2017-10-10 NOTE — Telephone Encounter (Signed)
-----   Message from Anola Gurneyobert Chauvin, [place] sent at 10/10/2017  7:24 AM EDT ----- Sugar and prostate labs normal. Continue antibiotics

## 2017-10-15 ENCOUNTER — Other Ambulatory Visit: Payer: Self-pay | Admitting: Family Medicine

## 2017-10-16 NOTE — Telephone Encounter (Signed)
Pt states he does not need a refill at this time.  The prescription was too expensive.   Thanks,   -Vernona RiegerLaura

## 2017-10-16 NOTE — Telephone Encounter (Signed)
Call him and see if he wants the continuation pack instead as he just completed the starter pack.

## 2017-11-10 ENCOUNTER — Other Ambulatory Visit: Payer: Self-pay | Admitting: Family Medicine

## 2017-11-10 ENCOUNTER — Other Ambulatory Visit: Payer: Self-pay

## 2017-11-10 DIAGNOSIS — M48062 Spinal stenosis, lumbar region with neurogenic claudication: Secondary | ICD-10-CM

## 2017-11-10 MED ORDER — OXYCODONE-ACETAMINOPHEN 5-325 MG PO TABS: 1.0000 | ORAL_TABLET | Freq: Four times a day (QID) | ORAL | 0 refills | Status: DC | PRN

## 2017-11-10 NOTE — Telephone Encounter (Signed)
Please review

## 2017-11-10 NOTE — Telephone Encounter (Signed)
Pt requesting refill [facility]codone. [facility] [address]

## 2017-12-13 ENCOUNTER — Other Ambulatory Visit: Payer: Self-pay

## 2017-12-13 DIAGNOSIS — M48062 Spinal stenosis, lumbar region with neurogenic claudication: Secondary | ICD-10-CM

## 2017-12-14 MED ORDER — OXYCODONE-ACETAMINOPHEN 5-325 MG PO TABS: 1.0000 | ORAL_TABLET | Freq: Four times a day (QID) | ORAL | 0 refills | Status: DC | PRN

## 2017-12-28 ENCOUNTER — Ambulatory Visit: Payer: Self-pay | Admitting: Family Medicine

## 2017-12-28 ENCOUNTER — Other Ambulatory Visit: Payer: Self-pay | Admitting: Family Medicine

## 2017-12-28 ENCOUNTER — Encounter: Payer: Self-pay | Admitting: Family Medicine

## 2017-12-28 VITALS — BP 140/78 | HR 86 | Temp 98.1°F | Resp 16 | Ht 67.0 in | Wt 193.6 lb

## 2017-12-28 DIAGNOSIS — R350 Frequency of micturition: Secondary | ICD-10-CM

## 2017-12-28 DIAGNOSIS — I10 Essential (primary) hypertension: Secondary | ICD-10-CM

## 2017-12-28 DIAGNOSIS — E782 Mixed hyperlipidemia: Secondary | ICD-10-CM

## 2017-12-28 MED ORDER — DOXAZOSIN MESYLATE 1 MG PO TABS: 1.0000 mg | ORAL_TABLET | Freq: Every day | ORAL | 0 refills | Status: DC

## 2017-12-28 NOTE — Patient Instructions (Signed)
We will call  yuou with the lab results.

## 2017-12-28 NOTE — Progress Notes (Signed)
  Subjective:     Patient ID: Alexander Mcbride, male   DOB: 1955/08/09, 62 y.o.   MRN: [id number] Chief Complaint  Patient presents with  . Urinary Frequency    Patient with c/o urinating frequency. He reports it is from the HCTZ. Reports it has been a week with out taking it. He reports is a little better since he stopped it.   . Hypertension    He reports that he wants to check his blood pressure since he stop the HCTZ and is only taking Losartan. He reports no symptoms.   HPI Reports nocturia x 3 since initial office visit 10/09/17 with normal labs. States he has noticed little change after stopping the diuretic.Paternal hx of coronary artery disease.  Review of Systems     Objective:   Physical Exam  Constitutional: He appears well-developed and well-nourished. No distress.  Cardiovascular: Normal rate and regular rhythm.  Pulmonary/Chest: Breath sounds normal.  Musculoskeletal: He exhibits no edema (of lower extremities).       Assessment:    1. Urinary frequency - doxazosin (CARDURA) 1 MG tablet; Take 1 tablet (1 mg total) by mouth at bedtime.  Dispense: 30 tablet; Refill: 0  2. Essential (primary) hypertension - doxazosin (CARDURA) 1 MG tablet; Take 1 tablet (1 mg total) by mouth at bedtime.  Dispense: 30 tablet; Refill: 0 - Comprehensive metabolic panel  3. Combined fat and carbohydrate induced hyperlipemia - Lipid panel    Plan:    Further f/u pending lab results and in two weeks.

## 2018-01-11 ENCOUNTER — Telehealth: Payer: Self-pay | Admitting: *Deleted

## 2018-01-11 NOTE — Telephone Encounter (Signed)
Patient called office requesting appt for 01/15/18 be rescheduled for tomorrow afternoon. Advised pt there are no available appt for tomorrow afternoon. Patient is a call back if someone drops off schedule tomorrow after 2.

## 2018-01-12 ENCOUNTER — Ambulatory Visit: Payer: Self-pay | Admitting: Family Medicine

## 2018-01-12 ENCOUNTER — Encounter: Payer: Self-pay | Admitting: Family Medicine

## 2018-01-12 VITALS — BP 112/58 | HR 76 | Temp 98.1°F | Resp 16 | Wt 194.6 lb

## 2018-01-12 DIAGNOSIS — M48062 Spinal stenosis, lumbar region with neurogenic claudication: Secondary | ICD-10-CM

## 2018-01-12 DIAGNOSIS — I10 Essential (primary) hypertension: Secondary | ICD-10-CM

## 2018-01-12 DIAGNOSIS — R351 Nocturia: Secondary | ICD-10-CM

## 2018-01-12 DIAGNOSIS — R350 Frequency of micturition: Secondary | ICD-10-CM

## 2018-01-12 MED ORDER — OXYCODONE-ACETAMINOPHEN 5-325 MG PO TABS: 1.0000 | ORAL_TABLET | Freq: Four times a day (QID) | ORAL | 0 refills | Status: DC | PRN

## 2018-01-12 MED ORDER — DOXAZOSIN MESYLATE 1 MG PO TABS: ORAL_TABLET | ORAL | 0 refills | Status: DC

## 2018-01-12 NOTE — Progress Notes (Signed)
  Subjective:     Patient ID: Alexander Mcbride, male   DOB: [birth year], 62 y.o.   MRN: [id number] Chief Complaint  Patient presents with  . 2 Week Follow-up    Patient returns to office today for follow up from 12/28/17. Patients blood pressure in office was 140/78, we started him on Doxazosin 1mg  qhs along with continuing Losartan. Patient reports fair compliance with Losartan and good compliance and tolerance n Doxazosin.    HPI States he continues to get up twice at night. He is no longer on HCTZ. Continues to require oxycodone for chronic left foot and back pain. Medication allows him to work at [employer] when he is on his feet all day.  Review of Systems     Objective:   Physical Exam  Constitutional: He appears well-developed and well-nourished. No distress.  Cardiovascular: Normal rate and regular rhythm.  Pulmonary/Chest: Breath sounds normal.  Musculoskeletal: He exhibits no edema (of lower extremities).       Assessment:    1. Nocturia  2. Essential (primary) hypertension - doxazosin (CARDURA) 1 MG tablet; Take two at bedtime  Dispense: 60 tablet; Refill: 0  3. Urinary frequency - doxazosin (CARDURA) 1 MG tablet; Take two at bedtime  Dispense: 60 tablet; Refill: 0  4. Spinal stenosis, lumbar region, with neurogenic claudication - oxyCODONE-acetaminophen (PERCOCET/ROXICET) 5-325 MG tablet; Take 1 tablet by mouth 4 (four) times daily as needed (back pain).  Dispense: 120 tablet; Refill: 0    Plan:    Further f/u in 3-4 weeks. To call if not tolerating medication.

## 2018-01-12 NOTE — Patient Instructions (Signed)
Let me know if you can't tolerate the medication. 

## 2018-01-12 NOTE — Telephone Encounter (Signed)
appt scheduled for 2 :40PM 

## 2018-01-15 ENCOUNTER — Ambulatory Visit: Payer: Self-pay | Admitting: Family Medicine

## 2018-01-19 ENCOUNTER — Other Ambulatory Visit: Payer: Self-pay | Admitting: Family Medicine

## 2018-01-19 DIAGNOSIS — R350 Frequency of micturition: Secondary | ICD-10-CM

## 2018-01-19 DIAGNOSIS — I10 Essential (primary) hypertension: Secondary | ICD-10-CM

## 2018-01-22 ENCOUNTER — Telehealth: Payer: Self-pay | Admitting: Family Medicine

## 2018-01-22 NOTE — Telephone Encounter (Signed)
Please review. KW 

## 2018-01-22 NOTE — Telephone Encounter (Signed)
Pt called saying the medication Doxazosin is not helping with his prostate problem.  She said you said to call back if it did not help  Pt's CB# [phone]  Alexander Bos[address] teri

## 2018-01-22 NOTE — Telephone Encounter (Signed)
Increase medication to 4 pills at bedtime (4 mg.) for 1-2 weeks

## 2018-01-22 NOTE — Telephone Encounter (Signed)
Patient has been advised. KW 

## 2018-01-23 ENCOUNTER — Other Ambulatory Visit: Payer: Self-pay | Admitting: Family Medicine

## 2018-02-05 ENCOUNTER — Ambulatory Visit: Payer: Self-pay | Admitting: Family Medicine

## 2018-02-05 ENCOUNTER — Encounter: Payer: Self-pay | Admitting: Family Medicine

## 2018-02-05 VITALS — BP 108/70 | HR 75 | Temp 99.5°F | Resp 16 | Wt 194.4 lb

## 2018-02-05 DIAGNOSIS — R351 Nocturia: Secondary | ICD-10-CM

## 2018-02-05 DIAGNOSIS — I10 Essential (primary) hypertension: Secondary | ICD-10-CM

## 2018-02-05 LAB — POCT URINALYSIS DIPSTICK
BILIRUBIN UA: NEGATIVE
Glucose, UA: NEGATIVE
KETONES UA: NEGATIVE
LEUKOCYTES UA: NEGATIVE
NITRITE UA: NEGATIVE
PH UA: 6.5 (ref 5.0–8.0)
PROTEIN UA: POSITIVE — AB
Spec Grav, UA: 1.015 (ref 1.010–1.025)
Urobilinogen, UA: 1 E.U./dL

## 2018-02-05 MED ORDER — TAMSULOSIN HCL 0.4 MG PO CAPS: 0.4000 mg | ORAL_CAPSULE | Freq: Every day | ORAL | 1 refills | Status: DC

## 2018-02-05 NOTE — Progress Notes (Signed)
  Subjective:     Patient ID: Alexander Mcbride, male   DOB: [birth year], 62 y.o.   MRN: [id number] Chief Complaint  Patient presents with  . Hypertension    Patient returns for follow up from 01/12/18, at last visit  patient blood pressure was 112/58. Patient reports that he stopped Doxazosin about a week and half ago.  . Nocturia    Patient comes in office today for follow up from 01/12/18, he states that he discontinued Doxazosin a week and half ago because it did not help with symptoms. Patient would like to discuss seeing a urologist.    HPI Reports he was up "every two hours" last night voiding. Starting to affect his work life as well.  Review of Systems     Objective:   Physical Exam  Constitutional: He appears well-developed and well-nourished. No distress.       Assessment:    1. Nocturia - tamsulosin (FLOMAX) 0.4 MG CAPS capsule; Take 1 capsule (0.4 mg total) by mouth daily.  Dispense: 30 capsule; Refill: 1 - POCT urinalysis dipstick  2. Essential (primary) hypertension: controlled    Plan:    If not improving will call for referral to urology Lonna Cobb),

## 2018-02-05 NOTE — Patient Instructions (Signed)
Try the new medicine for two weeks on one pill daily. If not helping may increase to two pills daily.

## 2018-02-06 ENCOUNTER — Ambulatory Visit: Payer: Self-pay | Admitting: Family Medicine

## 2018-02-12 ENCOUNTER — Other Ambulatory Visit: Payer: Self-pay | Admitting: Family Medicine

## 2018-02-12 DIAGNOSIS — R350 Frequency of micturition: Secondary | ICD-10-CM

## 2018-02-12 DIAGNOSIS — I10 Essential (primary) hypertension: Secondary | ICD-10-CM

## 2018-02-12 NOTE — Telephone Encounter (Signed)
Please review last office note and advise. KW 

## 2018-02-27 ENCOUNTER — Other Ambulatory Visit: Payer: Self-pay | Admitting: Family Medicine

## 2018-02-27 DIAGNOSIS — R351 Nocturia: Secondary | ICD-10-CM

## 2018-03-09 ENCOUNTER — Other Ambulatory Visit: Payer: Self-pay | Admitting: Family Medicine

## 2018-03-09 DIAGNOSIS — I10 Essential (primary) hypertension: Secondary | ICD-10-CM

## 2018-03-12 ENCOUNTER — Other Ambulatory Visit: Payer: Self-pay | Admitting: Family Medicine

## 2018-03-12 DIAGNOSIS — M48062 Spinal stenosis, lumbar region with neurogenic claudication: Secondary | ICD-10-CM

## 2018-03-12 NOTE — Telephone Encounter (Signed)
Pt needing a refill on the medication that helps with frequent urination.  Pt didn't know the name of the mediation.  Please fill at:   [facility] [address] [place] [facility] [address] [phone] (Phone) [phone] (Fax)    Thanks, [facility]

## 2018-03-13 ENCOUNTER — Other Ambulatory Visit: Payer: Self-pay | Admitting: Family Medicine

## 2018-03-13 DIAGNOSIS — R351 Nocturia: Secondary | ICD-10-CM

## 2018-03-13 MED ORDER — TAMSULOSIN HCL 0.4 MG PO CAPS: 0.4000 mg | ORAL_CAPSULE | Freq: Every day | ORAL | 1 refills | Status: DC

## 2018-03-13 NOTE — Telephone Encounter (Signed)
Im assuming hes meaning Flomax, please review chart . KW

## 2018-03-16 ENCOUNTER — Other Ambulatory Visit: Payer: Self-pay | Admitting: Family Medicine

## 2018-03-16 MED ORDER — OXYCODONE-ACETAMINOPHEN 5-325 MG PO TABS: 1.0000 | ORAL_TABLET | Freq: Four times a day (QID) | ORAL | 0 refills | Status: DC | PRN

## 2018-03-16 NOTE — Telephone Encounter (Signed)
Patient needs refill on Oxycodone-Acetaminophin [facility] [address]

## 2018-04-18 ENCOUNTER — Telehealth: Payer: Self-pay | Admitting: Family Medicine

## 2018-04-18 NOTE — Telephone Encounter (Signed)
Pt needing a refill on:  oxyCODONE-acetaminophen (PERCOCET/ROXICET) 5-325 MG tablet  Please fill at new pharmacy:  [facility] [id number] [facility] [place] [place] - [address] [phone] (Phone) [phone] (Fax)    Thanks, [facility]

## 2018-04-19 ENCOUNTER — Other Ambulatory Visit: Payer: Self-pay | Admitting: Family Medicine

## 2018-04-19 DIAGNOSIS — M48062 Spinal stenosis, lumbar region with neurogenic claudication: Secondary | ICD-10-CM

## 2018-04-19 MED ORDER — OXYCODONE-ACETAMINOPHEN 5-325 MG PO TABS: 1.0000 | ORAL_TABLET | Freq: Four times a day (QID) | ORAL | 0 refills | Status: DC | PRN

## 2018-04-19 NOTE — Telephone Encounter (Signed)
Last filled 03/16/18, please review. KW

## 2018-05-22 ENCOUNTER — Other Ambulatory Visit: Payer: Self-pay | Admitting: Family Medicine

## 2018-05-22 ENCOUNTER — Telehealth: Payer: Self-pay | Admitting: Family Medicine

## 2018-05-22 DIAGNOSIS — M48062 Spinal stenosis, lumbar region with neurogenic claudication: Secondary | ICD-10-CM

## 2018-05-22 MED ORDER — OXYCODONE-ACETAMINOPHEN 5-325 MG PO TABS: 1.0000 | ORAL_TABLET | Freq: Four times a day (QID) | ORAL | 0 refills | Status: DC | PRN

## 2018-05-22 NOTE — Telephone Encounter (Signed)
done

## 2018-05-22 NOTE — Telephone Encounter (Signed)
Please review last office visit was 01/12/2018, medication last filled 04/19/18. KW

## 2018-05-22 NOTE — Telephone Encounter (Signed)
Patient is requesting a refill on the following medication  oxyCODONE-acetaminophen (PERCOCET/ROXICET) 5-325 MG tablet   [facility]

## 2018-07-06 ENCOUNTER — Ambulatory Visit: Payer: BLUE CROSS/BLUE SHIELD | Admitting: Family Medicine

## 2018-07-06 ENCOUNTER — Encounter: Payer: Self-pay | Admitting: Family Medicine

## 2018-07-06 VITALS — BP 142/80 | HR 72 | Temp 97.6°F | Resp 16 | Wt 200.8 lb

## 2018-07-06 DIAGNOSIS — I10 Essential (primary) hypertension: Secondary | ICD-10-CM | POA: Diagnosis not present

## 2018-07-06 DIAGNOSIS — M48062 Spinal stenosis, lumbar region with neurogenic claudication: Secondary | ICD-10-CM | POA: Diagnosis not present

## 2018-07-06 MED ORDER — OXYCODONE-ACETAMINOPHEN 5-325 MG PO TABS: 1.0000 | ORAL_TABLET | Freq: Four times a day (QID) | ORAL | 0 refills | Status: DC | PRN

## 2018-07-06 NOTE — Progress Notes (Signed)
  Subjective:     Patient ID: Alexander Mcbride, male   DOB: 12/14/1955, 63 y.o.   MRN: [id number] Chief Complaint  Patient presents with  . Advice Only    Patient comes in office today to address medication management and new PCP.    HPI Wishes refill on pain medication. Reports he needs it the most to keep working at his job at [employer].States his father died recently. Wishes to see a male provider.  Review of Systems     Objective:   Physical Exam Constitutional:      General: He is not in acute distress. Neurological:     Mental Status: He is alert.        Assessment:    1. Spinal stenosis, lumbar region, with neurogenic claudication - oxyCODONE-acetaminophen (PERCOCET/ROXICET) 5-325 MG tablet; Take 1 tablet by mouth 4 (four) times daily as needed (back pain).  Dispense: 120 tablet; Refill: 0  2. Essential (primary) hypertension    Plan:    Establish with new provider in 4 weeks to reevaluate hypertension.

## 2018-07-06 NOTE — Patient Instructions (Signed)
Do follow up with an new provider in 4 weeks.

## 2018-08-06 ENCOUNTER — Ambulatory Visit: Payer: Self-pay | Admitting: Family Medicine

## 2018-08-14 ENCOUNTER — Other Ambulatory Visit: Payer: Self-pay | Admitting: Family Medicine

## 2018-08-14 DIAGNOSIS — I10 Essential (primary) hypertension: Secondary | ICD-10-CM

## 2018-08-14 DIAGNOSIS — M48062 Spinal stenosis, lumbar region with neurogenic claudication: Secondary | ICD-10-CM

## 2018-08-14 NOTE — Telephone Encounter (Signed)
Pt needs refill on his   Losartan 100 mg  Oxycodone 5-325  [facility] [address]  Was Bob's patient but will be seeing Dr. Sherrie Mustache  CB#  [phone]  Thanks Barth Kirks

## 2018-08-15 MED ORDER — LOSARTAN POTASSIUM 100 MG PO TABS: 100.0000 mg | ORAL_TABLET | Freq: Every day | ORAL | 1 refills | Status: DC

## 2018-08-15 MED ORDER — OXYCODONE-ACETAMINOPHEN 5-325 MG PO TABS: 1.0000 | ORAL_TABLET | Freq: Four times a day (QID) | ORAL | 0 refills | Status: DC | PRN

## 2018-09-05 ENCOUNTER — Telehealth: Payer: Self-pay

## 2018-09-05 NOTE — Telephone Encounter (Signed)
Patient states has been sick since Saturday with what he thinks is the flu.  He has had body aches and cough, he said no fever and he was not short of breath.  Has been using Alka-Seltzer Cold.  He is doing better but states his work will not let him come back until he is seen or gets tested for COVID 119  [phone]

## 2018-09-05 NOTE — Telephone Encounter (Signed)
Patient advised and verbally voiced understanding. Virtual visit scheduled Friday at 8:40am. [employer] Link sent to patient.

## 2018-09-05 NOTE — Telephone Encounter (Signed)
There is no Covid testing available except in ER. Guidelines are to self-isolate for at least 7 days from onset of symptoms, and at least 3 days after fever and bodyaches have resolved and cough has resolved.   If he has any shortness of breath or fever then he needs to go to ER. Otherwise he can go to work on Monday if he has no fever or body-aches over the weekend and if cough is better.  If he needs a note to return to work then he needs to schedule virtual visit Friday.

## 2018-09-07 ENCOUNTER — Ambulatory Visit (INDEPENDENT_AMBULATORY_CARE_PROVIDER_SITE_OTHER): Payer: BLUE CROSS/BLUE SHIELD | Admitting: Family Medicine

## 2018-09-07 ENCOUNTER — Other Ambulatory Visit: Payer: Self-pay

## 2018-09-07 ENCOUNTER — Encounter: Payer: Self-pay | Admitting: Family Medicine

## 2018-09-07 DIAGNOSIS — J069 Acute upper respiratory infection, unspecified: Secondary | ICD-10-CM | POA: Diagnosis not present

## 2018-09-07 NOTE — Patient Instructions (Signed)
.   Please review the attached list of medications and notify my office if there are any errors.   . Please bring all of your medications to every appointment so we can make sure that our medication list is the same as yours.   

## 2018-09-07 NOTE — Progress Notes (Signed)
       Patient: Alexander Mcbride Male    DOB: 05/30/1955   63 y.o.   MRN: [id number] Visit Date: 09/07/2018  Today's Provider: [name], MD   No chief complaint on file.  Subjective:    Virtual Visit via Video Note  I connected with Alexander Mcbride on 09/07/18 at  8:40 AM EDT by a video enabled telemedicine application and verified that I am speaking with the correct person using two identifiers.   I discussed the limitations of evaluation and management by telemedicine and the availability of in person appointments. The patient expressed understanding and agreed to proceed.   HPI  He reports abrupt onset of body aches and malaise on 09/01/2018 which improved with Alka-Setzer. He developed non-productive cough and slight sore throat  the next day. He never had fevers or chills. Body aches resolved within about 2 days. No Headaches. Continues to have mild persistent non-productive cough, no wheezing or dyspnea. Slightly congested in sinuses but no sinus pain or drainage. He is a former smoker and frequently has cough associated with minor URIs. He works at [employer] at advised he could not return to work without a doctor's note. He feels well today.    Allergies  Allergen Reactions  . Gabapentin Other (See Comments)    body aches  . Lisinopril Rash     Current Outpatient Medications:  .  losartan (COZAAR) 100 MG tablet, Take 1 tablet (100 mg total) by mouth daily., Disp: 90 tablet, Rfl: 1 .  oxyCODONE-acetaminophen (PERCOCET/ROXICET) 5-325 MG tablet, Take 1 tablet by mouth 4 (four) times daily as needed (back pain)., Disp: 120 tablet, Rfl: 0  Review of Systems  Social History   Tobacco Use  . Smoking status: Former Games developer  . Smokeless tobacco: Never Used  Substance Use Topics  . Alcohol use: Yes    Alcohol/week: 0.0 standard drinks    Comment: occasionally      Objective:    Temp=95.4 per patient report. Unable to check blood pressure.    Physical  Exam  General appearance: alert, well developed, well nourished, cooperative and in no distress Head: Normocephalic, without obvious abnormality, atraumatic Respiratory: Respirations even and unlabored, normal respiratory rate Skin: Skin color, texture, turgor normal. No rashes seen  Psych: Appropriate mood and affect. Neurologic: Mental status: Alert, oriented to person, place, and time, thought content appropriate.     Assessment & Plan    1. Upper respiratory tract infection, unspecified type Symptoms mild and brief, now all resolved except for minor non-productive cough. IHas had no fever. Low probability of infectious Covid-19. Is to call if sx worsen or if any fever or shortness of breath. Has now been six days since onset of sx. May return to work on Monday May 4th. Note for work written accordingly.      [name], MD  [facility]

## 2018-10-02 ENCOUNTER — Other Ambulatory Visit: Payer: Self-pay

## 2018-10-02 DIAGNOSIS — M48062 Spinal stenosis, lumbar region with neurogenic claudication: Secondary | ICD-10-CM

## 2018-10-02 MED ORDER — OXYCODONE-ACETAMINOPHEN 5-325 MG PO TABS: 1.0000 | ORAL_TABLET | Freq: Four times a day (QID) | ORAL | 0 refills | Status: DC | PRN

## 2018-10-16 ENCOUNTER — Other Ambulatory Visit: Payer: Self-pay

## 2018-10-16 ENCOUNTER — Encounter: Payer: Self-pay | Admitting: Family Medicine

## 2018-10-16 ENCOUNTER — Ambulatory Visit: Payer: BC Managed Care – PPO | Admitting: Family Medicine

## 2018-10-16 VITALS — BP 156/78 | HR 73 | Temp 98.2°F | Resp 16 | Wt 196.0 lb

## 2018-10-16 DIAGNOSIS — Z125 Encounter for screening for malignant neoplasm of prostate: Secondary | ICD-10-CM

## 2018-10-16 DIAGNOSIS — I10 Essential (primary) hypertension: Secondary | ICD-10-CM

## 2018-10-16 DIAGNOSIS — H6123 Impacted cerumen, bilateral: Secondary | ICD-10-CM

## 2018-10-16 NOTE — Patient Instructions (Signed)
. Please review the attached list of medications and notify my office if there are any errors.   . Please bring all of your medications to every appointment so we can make sure that our medication list is the same as yours.   . It is recommended to engage in 150 minutes of moderate exercise every week.    DASH Eating Plan DASH stands for "Dietary Approaches to Stop Hypertension." The DASH eating plan is a healthy eating plan that has been shown to reduce high blood pressure (hypertension). It may also reduce your risk for type 2 diabetes, heart disease, and stroke. The DASH eating plan may also help with weight loss. What are tips for following this plan?  General guidelines  Avoid eating more than 2,300 mg (milligrams) of salt (sodium) a day. If you have hypertension, you may need to reduce your sodium intake to 1,500 mg a day.  Limit alcohol intake to no more than 1 drink a day for nonpregnant women and 2 drinks a day for men. One drink equals 12 oz of beer, 5 oz of wine, or 1 oz of hard liquor.  Work with your health care provider to maintain a healthy body weight or to lose weight. Ask what an ideal weight is for you.  Get at least 30 minutes of exercise that causes your heart to beat faster (aerobic exercise) most days of the week. Activities may include walking, swimming, or biking.  Work with your health care provider or diet and nutrition specialist (dietitian) to adjust your eating plan to your individual calorie needs. Reading food labels   Check food labels for the amount of sodium per serving. Choose foods with less than 5 percent of the Daily Value of sodium. Generally, foods with less than 300 mg of sodium per serving fit into this eating plan.  To find whole grains, look for the word "whole" as the first word in the ingredient list. Shopping  Buy products labeled as "low-sodium" or "no salt added."  Buy fresh foods. Avoid canned foods and premade or frozen meals.  Cooking  Avoid adding salt when cooking. Use salt-free seasonings or herbs instead of table salt or sea salt. Check with your health care provider or pharmacist before using salt substitutes.  Do not fry foods. Cook foods using healthy methods such as baking, boiling, grilling, and broiling instead.  Cook with heart-healthy oils, such as olive, canola, soybean, or sunflower oil. Meal planning  Eat a balanced diet that includes: ? 5 or more servings of fruits and vegetables each day. At each meal, try to fill half of your plate with fruits and vegetables. ? Up to 6-8 servings of whole grains each day. ? Less than 6 oz of lean meat, poultry, or fish each day. A 3-oz serving of meat is about the same size as a deck of cards. One egg equals 1 oz. ? 2 servings of low-fat dairy each day. ? A serving of nuts, seeds, or beans 5 times each week. ? Heart-healthy fats. Healthy fats called Omega-3 fatty acids are found in foods such as flaxseeds and coldwater fish, like sardines, salmon, and mackerel.  Limit how much you eat of the following: ? Canned or prepackaged foods. ? Food that is high in trans fat, such as fried foods. ? Food that is high in saturated fat, such as fatty meat. ? Sweets, desserts, sugary drinks, and other foods with added sugar. ? Full-fat dairy products.  Do not salt foods before   eating.  Try to eat at least 2 vegetarian meals each week.  Eat more home-cooked food and less restaurant, buffet, and fast food.  When eating at a restaurant, ask that your food be prepared with less salt or no salt, if possible. What foods are recommended? The items listed may not be a complete list. Talk with your dietitian about what dietary choices are best for you. Grains Whole-grain or whole-wheat bread. Whole-grain or whole-wheat pasta. Brown rice. Oatmeal. Quinoa. Bulgur. Whole-grain and low-sodium cereals. Pita bread. Low-fat, low-sodium crackers. Whole-wheat flour tortillas.  Vegetables Fresh or frozen vegetables (raw, steamed, roasted, or grilled). Low-sodium or reduced-sodium tomato and vegetable juice. Low-sodium or reduced-sodium tomato sauce and tomato paste. Low-sodium or reduced-sodium canned vegetables. Fruits All fresh, dried, or frozen fruit. Canned fruit in natural juice (without added sugar). Meat and other protein foods Skinless chicken or turkey. Ground chicken or turkey. Pork with fat trimmed off. Fish and seafood. Egg whites. Dried beans, peas, or lentils. Unsalted nuts, nut butters, and seeds. Unsalted canned beans. Lean cuts of beef with fat trimmed off. Low-sodium, lean deli meat. Dairy Low-fat (1%) or fat-free (skim) milk. Fat-free, low-fat, or reduced-fat cheeses. Nonfat, low-sodium ricotta or cottage cheese. Low-fat or nonfat yogurt. Low-fat, low-sodium cheese. Fats and oils Soft margarine without trans fats. Vegetable oil. Low-fat, reduced-fat, or light mayonnaise and salad dressings (reduced-sodium). Canola, safflower, olive, soybean, and sunflower oils. Avocado. Seasoning and other foods Herbs. Spices. Seasoning mixes without salt. Unsalted popcorn and pretzels. Fat-free sweets. What foods are not recommended? The items listed may not be a complete list. Talk with your dietitian about what dietary choices are best for you. Grains Baked goods made with fat, such as croissants, muffins, or some breads. Dry pasta or rice meal packs. Vegetables Creamed or fried vegetables. Vegetables in a cheese sauce. Regular canned vegetables (not low-sodium or reduced-sodium). Regular canned tomato sauce and paste (not low-sodium or reduced-sodium). Regular tomato and vegetable juice (not low-sodium or reduced-sodium). Pickles. Olives. Fruits Canned fruit in a light or heavy syrup. Fried fruit. Fruit in cream or butter sauce. Meat and other protein foods Fatty cuts of meat. Ribs. Fried meat. Bacon. Sausage. Bologna and other processed lunch meats. Salami.  Fatback. Hotdogs. Bratwurst. Salted nuts and seeds. Canned beans with added salt. Canned or smoked fish. Whole eggs or egg yolks. Chicken or turkey with skin. Dairy Whole or 2% milk, cream, and half-and-half. Whole or full-fat cream cheese. Whole-fat or sweetened yogurt. Full-fat cheese. Nondairy creamers. Whipped toppings. Processed cheese and cheese spreads. Fats and oils Butter. Stick margarine. Lard. Shortening. Ghee. Bacon fat. Tropical oils, such as coconut, palm kernel, or palm oil. Seasoning and other foods Salted popcorn and pretzels. Onion salt, garlic salt, seasoned salt, table salt, and sea salt. Worcestershire sauce. Tartar sauce. Barbecue sauce. Teriyaki sauce. Soy sauce, including reduced-sodium. Steak sauce. Canned and packaged gravies. Fish sauce. Oyster sauce. Cocktail sauce. Horseradish that you find on the shelf. Ketchup. Mustard. Meat flavorings and tenderizers. Bouillon cubes. Hot sauce and Tabasco sauce. Premade or packaged marinades. Premade or packaged taco seasonings. Relishes. Regular salad dressings. Where to find more information:  National Heart, Lung, and Blood Institute: www.nhlbi.nih.gov  American Heart Association: www.heart.org Summary  The DASH eating plan is a healthy eating plan that has been shown to reduce high blood pressure (hypertension). It may also reduce your risk for type 2 diabetes, heart disease, and stroke.  With the DASH eating plan, you should limit salt (sodium) intake to 2,300 mg a   day. If you have hypertension, you may need to reduce your sodium intake to 1,500 mg a day.  When on the DASH eating plan, aim to eat more fresh fruits and vegetables, whole grains, lean proteins, low-fat dairy, and heart-healthy fats.  Work with your health care provider or diet and nutrition specialist (dietitian) to adjust your eating plan to your individual calorie needs. This information is not intended to replace advice given to you by your health care  provider. Make sure you discuss any questions you have with your health care provider. Document Released: 04/14/2011 Document Revised: 04/18/2016 Document Reviewed: 04/18/2016 Elsevier Interactive Patient Education  2019 Elsevier Inc.  

## 2018-10-16 NOTE — Progress Notes (Signed)
Patient: Alexander Mcbride Male    DOB: [birth year]   63 y.o.   MRN: [id number] Visit Date: 10/16/2018  Today's Provider: Lelon Huh, MD   Chief Complaint  Patient presents with  . Hypertension  . Ear Fullness    Bilateral; started about four days ago.    Subjective:     Ear Fullness   There is pain in both ears. This is a new problem. The current episode started in the past 7 days. The problem occurs constantly. The problem has been gradually worsening. There has been no fever. The pain is mild. Associated symptoms include hearing loss. Pertinent negatives include no abdominal pain, coughing, diarrhea, ear discharge, headaches, neck pain, rash, rhinorrhea, sore throat or vomiting. He has tried nothing for the symptoms.       Hypertension, follow-up:  BP Readings from Last 3 Encounters:  10/16/18 (!) 152/80  07/06/18 (!) 142/80  02/05/18 108/70    He was last seen for hypertension 4 months ago.  BP at that visit was 142/80. Management since that visit includes No changes. He reports excellent compliance with treatment. He is not having side effects.  He is exercising. He is not adherent to low salt diet.   Outside blood pressures are 140-150's/ pt does not remember the bottoms. He is experiencing none Patient denies chest pain, lower extremity edema, palpitations and paroxysmal nocturnal dyspnea.   Cardiovascular risk factors include advanced age (older than 19 for men, 65 for women), hypertension and male gender.  Use of agents associated with hypertension: none.     Weight trend: stable Wt Readings from Last 3 Encounters:  10/16/18 196 lb (88.9 kg)  07/06/18 200 lb 12.8 oz (91.1 kg)  02/05/18 194 lb 6.4 oz (88.2 kg)    Current diet: in general, a "healthy" diet    ------------------------------------------------------------------------    Allergies  Allergen Reactions  . Gabapentin Other (See Comments)    body aches  . Lisinopril Rash      Current Outpatient Medications:  .  losartan (COZAAR) 100 MG tablet, Take 1 tablet (100 mg total) by mouth daily., Disp: 90 tablet, Rfl: 1 .  oxyCODONE-acetaminophen (PERCOCET/ROXICET) 5-325 MG tablet, Take 1 tablet by mouth 4 (four) times daily as needed (back pain)., Disp: 120 tablet, Rfl: 0  Review of Systems  Constitutional: Negative.   HENT: Positive for ear pain, hearing loss and tinnitus. Negative for ear discharge, rhinorrhea and sore throat.   Respiratory: Negative.  Negative for cough.   Cardiovascular: Negative.   Gastrointestinal: Negative.  Negative for abdominal pain, diarrhea and vomiting.  Musculoskeletal: Negative for neck pain.  Skin: Negative for rash.  Neurological: Negative for headaches.    Social History   Tobacco Use  . Smoking status: Former Research scientist (life sciences)  . Smokeless tobacco: Never Used  Substance Use Topics  . Alcohol use: Yes    Alcohol/week: 0.0 standard drinks    Comment: occasionally      Objective:   BP (!) 152/80 (BP Location: Right Arm, Patient Position: Sitting, Cuff Size: Large)   Pulse 73   Temp 98.2 F (36.8 C) (Oral)   Resp 16   Wt 196 lb (88.9 kg)   BMI 30.70 kg/m  Vitals:   10/16/18 1533  BP: (!) 152/80  Pulse: 73  Resp: 16  Temp: 98.2 F (36.8 C)  TempSrc: Oral  Weight: 196 lb (88.9 kg)     Physical Exam  General Appearance:    Alert, cooperative, no  distress  HENT:   Both canals obstructed with cerumen.   Eyes:    PERRL, conjunctiva/corneas clear, EOM's intact       Lungs:     Clear to auscultation bilaterally, respirations unlabored  Heart:    Regular rate and rhythm  Neurologic:   Awake, alert, oriented x 3. No apparent focal neurological           defect.           Assessment & Plan    1. Essential (primary) hypertension BP higher than usual today. Counseled regarding prudent diet and regular exercise.  Advised may need additional medications if not improved at follow up.  - Comprehensive metabolic panel -  Lipid panel  2. Prostate cancer screening  - PSA  3. Bilateral impacted cerumen After soaking with Debrox, ear canals were irrigated with water until clear. Patient tolerated procedure well.       The entirety of the information documented in the History of Present Illness, Review of Systems and Physical Exam were personally obtained by me. Portions of this information were initially documented by Kavin LeechLaura Walsh, CMA and reviewed by me for thoroughness and accuracy.   Mila Merryonald Fisher, MD  [facility]

## 2018-10-29 DIAGNOSIS — I10 Essential (primary) hypertension: Secondary | ICD-10-CM | POA: Diagnosis not present

## 2018-10-29 DIAGNOSIS — Z125 Encounter for screening for malignant neoplasm of prostate: Secondary | ICD-10-CM | POA: Diagnosis not present

## 2018-10-30 LAB — COMPREHENSIVE METABOLIC PANEL
ALT: 30 IU/L (ref 0–44)
AST: 21 IU/L (ref 0–40)
Albumin/Globulin Ratio: 1.8 (ref 1.2–2.2)
Albumin: 4.6 g/dL (ref 3.8–4.8)
Alkaline Phosphatase: 95 IU/L (ref 39–117)
BUN/Creatinine Ratio: 13 (ref 10–24)
BUN: 13 mg/dL (ref 8–27)
Bilirubin Total: 0.6 mg/dL (ref 0.0–1.2)
CO2: 22 mmol/L (ref 20–29)
Calcium: 9.6 mg/dL (ref 8.6–10.2)
Chloride: 103 mmol/L (ref 96–106)
Creatinine, Ser: 1.03 mg/dL (ref 0.76–1.27)
GFR calc Af Amer: 90 mL/min/{1.73_m2} (ref >59)
GFR calc non Af Amer: 78 mL/min/{1.73_m2} (ref >59)
Globulin, Total: 2.5 g/dL (ref 1.5–4.5)
Glucose: 111 mg/dL — ABNORMAL HIGH (ref 65–99)
Potassium: 4 mmol/L (ref 3.5–5.2)
Sodium: 139 mmol/L (ref 134–144)
Total Protein: 7.1 g/dL (ref 6.0–8.5)

## 2018-10-30 LAB — LIPID PANEL
Chol/HDL Ratio: 4.9 ratio (ref 0.0–5.0)
Cholesterol, Total: 207 mg/dL — ABNORMAL HIGH (ref 100–199)
HDL: 42 mg/dL (ref >39)
LDL Calculated: 107 mg/dL — ABNORMAL HIGH (ref 0–99)
Triglycerides: 288 mg/dL — ABNORMAL HIGH (ref 0–149)
VLDL Cholesterol Cal: 58 mg/dL — ABNORMAL HIGH (ref 5–40)

## 2018-10-30 LAB — PSA: Prostate Specific Ag, Serum: 0.6 ng/mL (ref 0.0–4.0)

## 2018-11-08 ENCOUNTER — Other Ambulatory Visit: Payer: Self-pay

## 2018-11-08 DIAGNOSIS — M48062 Spinal stenosis, lumbar region with neurogenic claudication: Secondary | ICD-10-CM

## 2018-11-08 MED ORDER — OXYCODONE-ACETAMINOPHEN 5-325 MG PO TABS: 1.0000 | ORAL_TABLET | Freq: Four times a day (QID) | ORAL | 0 refills | Status: DC | PRN

## 2018-12-10 ENCOUNTER — Other Ambulatory Visit: Payer: Self-pay

## 2018-12-10 DIAGNOSIS — M48062 Spinal stenosis, lumbar region with neurogenic claudication: Secondary | ICD-10-CM

## 2018-12-10 DIAGNOSIS — I10 Essential (primary) hypertension: Secondary | ICD-10-CM

## 2018-12-12 MED ORDER — LOSARTAN POTASSIUM 100 MG PO TABS: 100.0000 mg | ORAL_TABLET | Freq: Every day | ORAL | 1 refills | Status: DC

## 2018-12-12 MED ORDER — OXYCODONE-ACETAMINOPHEN 5-325 MG PO TABS: 1.0000 | ORAL_TABLET | Freq: Four times a day (QID) | ORAL | 0 refills | Status: DC | PRN

## 2018-12-24 ENCOUNTER — Other Ambulatory Visit: Payer: Self-pay

## 2018-12-24 ENCOUNTER — Ambulatory Visit (INDEPENDENT_AMBULATORY_CARE_PROVIDER_SITE_OTHER): Payer: BC Managed Care – PPO | Admitting: Family Medicine

## 2018-12-24 DIAGNOSIS — Z20822 Contact with and (suspected) exposure to covid-19: Secondary | ICD-10-CM

## 2018-12-24 DIAGNOSIS — R05 Cough: Secondary | ICD-10-CM

## 2018-12-24 DIAGNOSIS — J029 Acute pharyngitis, unspecified: Secondary | ICD-10-CM

## 2018-12-24 DIAGNOSIS — R059 Cough, unspecified: Secondary | ICD-10-CM

## 2018-12-24 NOTE — Progress Notes (Signed)
       Patient: Alexander Mcbride Male    DOB: [birth year]   63 y.o.   MRN: [id number] Visit Date: 12/24/2018  Today's Provider: Wilhemena Durie, MD   Chief Complaint  Patient presents with  . Sore Throat   Subjective:     HPI   Sore throat  Mild sore throat and a "tickle cough" that has almost resolved. No fever or chills or myalgias or known covid exposure but Pt works at [employer] so he is not completely sure. Virtual Visit via Video Note  I connected with Alexander Mcbride on 12/24/18 at 11:20 AM EDT by a video enabled telemedicine application and verified that I am speaking with the correct person using two identifiers.    I discussed the limitations of evaluation and management by telemedicine and the availability of in person appointments. The patient expressed understanding and agreed to proceed.     I discussed the assessment and treatment plan with the patient. The patient was provided an opportunity to ask questions and all were answered. The patient agreed with the plan and demonstrated an understanding of the instructions.   The patient was advised to call back or seek an in-person evaluation if the symptoms worsen or if the condition fails to improve as anticipated.   Allergies  Allergen Reactions  . Gabapentin Other (See Comments)    body aches  . Lisinopril Rash     Current Outpatient Medications:  .  losartan (COZAAR) 100 MG tablet, Take 1 tablet (100 mg total) by mouth daily., Disp: 90 tablet, Rfl: 1 .  oxyCODONE-acetaminophen (PERCOCET/ROXICET) 5-325 MG tablet, Take 1 tablet by mouth 4 (four) times daily as needed (back pain)., Disp: 120 tablet, Rfl: 0  Review of Systems See HPI. Social History   Tobacco Use  . Smoking status: Former Research scientist (life sciences)  . Smokeless tobacco: Never Used  Substance Use Topics  . Alcohol use: Yes    Alcohol/week: 0.0 standard drinks    Comment: occasionally      Objective:   There were no vitals taken for this  visit. There were no vitals filed for this visit.   Physical Exam Well appearing man in NAD.  No results found for any visits on 12/24/18.     Assessment & Plan    1. Sore throat Out of work until ordered covid test negative. Salt water gargles.  2. Cough This cough could be viral or allergic or GERD could cause all of this . F/u as needed.     Richard Cranford Mon, MD  [facility]

## 2018-12-25 ENCOUNTER — Telehealth: Payer: Self-pay

## 2018-12-25 LAB — NOVEL CORONAVIRUS, NAA: SARS-CoV-2, NAA: NOT DETECTED

## 2018-12-25 NOTE — Telephone Encounter (Signed)
Patient notified of results. Would like a note to return to work, but still having some sore throat pain.

## 2018-12-25 NOTE — Telephone Encounter (Signed)
-----   Message from Jerrol Banana., MD sent at 12/25/2018 12:50 PM EDT ----- No covid

## 2018-12-25 NOTE — Telephone Encounter (Signed)
Advised patient as below. Letter written.  

## 2018-12-25 NOTE — Telephone Encounter (Signed)
Ok to give him return to work note. Gargles and lozenges for sore throat. May need OV in future if not better.

## 2019-01-11 ENCOUNTER — Other Ambulatory Visit: Payer: Self-pay | Admitting: *Deleted

## 2019-01-11 DIAGNOSIS — M48062 Spinal stenosis, lumbar region with neurogenic claudication: Secondary | ICD-10-CM

## 2019-01-11 MED ORDER — OXYCODONE-ACETAMINOPHEN 5-325 MG PO TABS: 1.0000 | ORAL_TABLET | Freq: Four times a day (QID) | ORAL | 0 refills | Status: DC | PRN

## 2019-02-12 ENCOUNTER — Other Ambulatory Visit: Payer: Self-pay

## 2019-02-12 DIAGNOSIS — M48062 Spinal stenosis, lumbar region with neurogenic claudication: Secondary | ICD-10-CM

## 2019-02-12 MED ORDER — OXYCODONE-ACETAMINOPHEN 5-325 MG PO TABS: 1.0000 | ORAL_TABLET | Freq: Four times a day (QID) | ORAL | 0 refills | Status: DC | PRN

## 2019-02-12 NOTE — Telephone Encounter (Signed)
Patient called requesting a refill on his Percocet 5-325MG . L.O.V. was 12/24/2018,please advise.

## 2019-02-15 ENCOUNTER — Ambulatory Visit: Payer: Self-pay | Admitting: Family Medicine

## 2019-03-04 ENCOUNTER — Other Ambulatory Visit: Payer: Self-pay

## 2019-03-04 DIAGNOSIS — Z20822 Contact with and (suspected) exposure to covid-19: Secondary | ICD-10-CM

## 2019-03-05 LAB — NOVEL CORONAVIRUS, NAA: SARS-CoV-2, NAA: NOT DETECTED

## 2019-03-08 ENCOUNTER — Other Ambulatory Visit: Payer: Self-pay

## 2019-03-08 ENCOUNTER — Telehealth: Payer: Self-pay | Admitting: Family Medicine

## 2019-03-08 DIAGNOSIS — Z20822 Contact with and (suspected) exposure to covid-19: Secondary | ICD-10-CM

## 2019-03-08 DIAGNOSIS — I10 Essential (primary) hypertension: Secondary | ICD-10-CM

## 2019-03-08 MED ORDER — LOSARTAN POTASSIUM 100 MG PO TABS: 100.0000 mg | ORAL_TABLET | Freq: Every day | ORAL | 1 refills | Status: DC

## 2019-03-08 NOTE — Telephone Encounter (Signed)
Pt needs refill Losartan   [facility]

## 2019-03-09 LAB — NOVEL CORONAVIRUS, NAA: SARS-CoV-2, NAA: NOT DETECTED

## 2019-03-12 ENCOUNTER — Encounter: Payer: Self-pay | Admitting: Family Medicine

## 2019-03-12 ENCOUNTER — Ambulatory Visit (INDEPENDENT_AMBULATORY_CARE_PROVIDER_SITE_OTHER): Payer: BC Managed Care – PPO | Admitting: Family Medicine

## 2019-03-12 ENCOUNTER — Other Ambulatory Visit: Payer: Self-pay

## 2019-03-12 VITALS — BP 148/80 | HR 78 | Temp 97.3°F | Resp 16 | Wt 206.0 lb

## 2019-03-12 DIAGNOSIS — Z23 Encounter for immunization: Secondary | ICD-10-CM | POA: Diagnosis not present

## 2019-03-12 DIAGNOSIS — G4700 Insomnia, unspecified: Secondary | ICD-10-CM | POA: Diagnosis not present

## 2019-03-12 DIAGNOSIS — M48062 Spinal stenosis, lumbar region with neurogenic claudication: Secondary | ICD-10-CM

## 2019-03-12 DIAGNOSIS — I10 Essential (primary) hypertension: Secondary | ICD-10-CM | POA: Diagnosis not present

## 2019-03-12 MED ORDER — OXYCODONE-ACETAMINOPHEN 5-325 MG PO TABS: 1.0000 | ORAL_TABLET | Freq: Four times a day (QID) | ORAL | 0 refills | Status: DC | PRN

## 2019-03-12 MED ORDER — AMLODIPINE BESYLATE 5 MG PO TABS: 5.0000 mg | ORAL_TABLET | Freq: Every evening | ORAL | 2 refills | Status: DC

## 2019-03-12 MED ORDER — TRAZODONE HCL 100 MG PO TABS: 100.0000 mg | ORAL_TABLET | Freq: Every day | ORAL | 2 refills | Status: DC

## 2019-03-12 NOTE — Progress Notes (Signed)
Patient: Alexander Mcbride Male    DOB: 08-22-1955   63 y.o.   MRN: [id number] Visit Date: 03/12/2019  Today's Provider: [name], MD   Chief Complaint  Patient presents with  . Hypertension   Subjective:     HPI  Hypertension, follow-up:     BP Readings from Last 3 Encounters:  10/16/18 (!) 152/80  07/06/18 (!) 142/80  02/05/18 108/70    He was last seen for hypertension 5 months ago.  BP at that visit was 152/80. Management since that visit includes advised to diet and regular exercise He reports excellent compliance with treatment. He is not having side effects.  He is exercising. He is not adherent to low salt diet.   Outside blood pressures are not being checked He is experiencing none Patient denies chest pain, lower extremity edema, palpitations and paroxysmal nocturnal dyspnea.   Cardiovascular risk factors include advanced age (older than 40 for men, 57 for women), hypertension and male gender.  Use of agents associated with hypertension: none.                Weight trend: stable    Wt Readings from Last 3 Encounters:  10/16/18 196 lb (88.9 kg)  07/06/18 200 lb 12.8 oz (91.1 kg)  02/05/18 194 lb 6.4 oz (88.2 kg)    Current diet: in general, a "healthy" diet    ------------------------------------------------------------------------  Follow up for Spinal stenosis, lumbar region, with neurogenic claudication:  The patient was last seen for this 8 months ago. Changes made at last visit include none.  He reports good compliance with treatment. He feels that condition is Unchanged. He is not having side effects.  Reports current regiment of oxycodone/apap is working well  He also complains of having difficulty staying asleep through the night. Nothing in particular wakes him up PHQ9 SCORE ONLY 03/12/2019 02/19/2015  Score 0 0      Allergies  Allergen Reactions  . Gabapentin Other (See Comments)    body aches  .  Lisinopril Rash     Current Outpatient Medications:  .  losartan (COZAAR) 100 MG tablet, Take 1 tablet (100 mg total) by mouth daily., Disp: 90 tablet, Rfl: 1 .  oxyCODONE-acetaminophen (PERCOCET/ROXICET) 5-325 MG tablet, Take 1 tablet by mouth 4 (four) times daily as needed (back pain)., Disp: 120 tablet, Rfl: 0  Review of Systems  Constitutional: Negative.   Respiratory: Negative.   Cardiovascular: Positive for leg swelling (left foot).  Musculoskeletal: Negative.     Social History   Tobacco Use  . Smoking status: Former Games developer  . Smokeless tobacco: Never Used  Substance Use Topics  . Alcohol use: Yes    Alcohol/week: 0.0 standard drinks    Comment: occasionally      Objective:    Vitals:   03/12/19 1545 03/12/19 1546  BP: (!) 150/80 (!) 148/80  Pulse: 78   Resp: 16   Temp: (!) 97.3 F (36.3 C)   TempSrc: Temporal   SpO2: 98%   Weight: 206 lb (93.4 kg)   Body mass index is 32.26 kg/m.   Physical Exam   General Appearance:    Well developed, well nourished male in no acute distress  Eyes:    PERRL, conjunctiva/corneas clear, EOM's intact       Lungs:     Clear to auscultation bilaterally, respirations unlabored  Heart:    Normal heart rate. Normal rhythm. No murmurs, rubs, or gallops.   MS:  All extremities are intact.   Neurologic:   Awake, alert, oriented x 3. No apparent focal neurological           defect.          Assessment & Plan    1. Essential (primary) hypertension Uncontrolled, add,  - amLODipine (NORVASC) 5 MG tablet; Take 1 tablet (5 mg total) by mouth every evening.  Dispense: 30 tablet; Refill: 2  2. Spinal stenosis, lumbar region, with neurogenic claudication Pain fairly well controlled, refill- oxyCODONE-acetaminophen (PERCOCET/ROXICET) 5-325 MG tablet; Take 1 tablet by mouth 4 (four) times daily as needed (back pain).  Dispense: 120 tablet; Refill: 0  3. Insomnia, unspecified type Trial of - traZODone (DESYREL) 100 MG tablet; Take  1-1.5 tablets (100-150 mg total) by mouth at bedtime.  Dispense: 45 tablet; Refill: 2  4. Need for influenza vaccination  - Flu Vaccine QUAD 6+ mos PF IM (Fluarix Quad PF)  Future Appointments  Date Time Provider [place]  06/18/2019  4:00 PM Caryn Section Kirstie Peri, MD [facility] None     The entirety of the information documented in the History of Present Illness, Review of Systems and Physical Exam were personally obtained by me. Portions of this information were initially documented by Idelle Jo, CMA and reviewed by me for thoroughness and accuracy.     Lelon Huh, MD  [facility]

## 2019-03-12 NOTE — Patient Instructions (Signed)
.   Please review the attached list of medications and notify my office if there are any errors.   . Please bring all of your medications to every appointment so we can make sure that our medication list is the same as yours.   . It is especially important to get the annual flu vaccine this year. If you haven't had it already, please go to your pharmacy or call the office as soon as possible to schedule you flu shot.  

## 2019-03-26 ENCOUNTER — Ambulatory Visit (INDEPENDENT_AMBULATORY_CARE_PROVIDER_SITE_OTHER): Payer: BC Managed Care – PPO | Admitting: Family Medicine

## 2019-03-26 ENCOUNTER — Encounter: Payer: Self-pay | Admitting: Family Medicine

## 2019-03-26 ENCOUNTER — Other Ambulatory Visit: Payer: Self-pay

## 2019-03-26 VITALS — BP 180/79 | HR 92 | Temp 97.5°F | Resp 16 | Wt 206.4 lb

## 2019-03-26 DIAGNOSIS — R300 Dysuria: Secondary | ICD-10-CM | POA: Diagnosis not present

## 2019-03-26 DIAGNOSIS — Z87442 Personal history of urinary calculi: Secondary | ICD-10-CM | POA: Diagnosis not present

## 2019-03-26 LAB — POCT URINALYSIS DIPSTICK
Bilirubin, UA: NEGATIVE
Glucose, UA: NEGATIVE
Ketones, UA: NEGATIVE
Leukocytes, UA: NEGATIVE
Nitrite, UA: NEGATIVE
Odor: NEGATIVE
Protein, UA: NEGATIVE
Spec Grav, UA: 1.015 (ref 1.010–1.025)
Urobilinogen, UA: 0.2 E.U./dL
pH, UA: 6 (ref 5.0–8.0)

## 2019-03-26 MED ORDER — PHENAZOPYRIDINE HCL 200 MG PO TABS: 200.0000 mg | ORAL_TABLET | Freq: Three times a day (TID) | ORAL | 0 refills | Status: DC | PRN

## 2019-03-26 MED ORDER — CIPROFLOXACIN HCL 500 MG PO TABS: 500.0000 mg | ORAL_TABLET | Freq: Two times a day (BID) | ORAL | 0 refills | Status: DC
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
## 2019-03-28 ENCOUNTER — Telehealth: Payer: Self-pay

## 2019-03-28 LAB — URINE CULTURE: Organism ID, Bacteria: NO GROWTH

## 2019-03-28 LAB — SPECIMEN STATUS REPORT

## 2019-03-28 NOTE — Telephone Encounter (Signed)
Left message for patient to call back for lab results.  Ok to give when he calls back

## 2019-03-28 NOTE — Telephone Encounter (Signed)
-----   Message from Margo Common, [place] sent at 03/28/2019  8:16 AM EST ----- No bacterial growth on urine culture. The Cipro should help if any prostate infection. If pain persists, should schedule renal ultrasound or recheck with Dr. Jacqlyn Larsen regarding possible kidney stone.

## 2019-04-05 ENCOUNTER — Other Ambulatory Visit: Payer: Self-pay | Admitting: Family Medicine

## 2019-04-05 DIAGNOSIS — G4700 Insomnia, unspecified: Secondary | ICD-10-CM

## 2019-04-05 NOTE — Telephone Encounter (Signed)
Requested medication (s) are due for refill today: yes  Requested medication (s) are on the active medication list: yes  Last refill:  03/12/2019  Future visit scheduled: no  Notes to clinic:  Requesting for a 90 day supply   Requested Prescriptions  Pending Prescriptions Disp Refills   traZODone (DESYREL) 100 MG tablet [Pharmacy Med Name: TRAZODONE 100 MG TABLET] 135 tablet 1    Sig: Take 1-1.5 tablets (100-150 mg total) by mouth at bedtime.     Psychiatry: Antidepressants - Serotonin Modulator Failed - 04/05/2019  1:33 PM      Failed - Completed PHQ-2 or PHQ-9 in the last 360 days.      Passed - Valid encounter within last 6 months    Recent Outpatient Visits          1 week ago Brooke, Vickki Muff, [place]   3 weeks ago Essential (primary) hypertension   [facility] Birdie Sons, MD   3 months ago Sore throat   [facility] Jerrol Banana., MD   5 months ago Essential (primary) hypertension   [facility] Birdie Sons, MD   7 months ago Upper respiratory tract infection, unspecified type   [facility] Birdie Sons, MD      Future Appointments            In 2 months Fisher, Kirstie Peri, MD [facility] [place]

## 2019-04-09 ENCOUNTER — Telehealth: Payer: Self-pay

## 2019-04-09 DIAGNOSIS — R300 Dysuria: Secondary | ICD-10-CM

## 2019-04-09 MED ORDER — CIPROFLOXACIN HCL 500 MG PO TABS: 500.0000 mg | ORAL_TABLET | Freq: Two times a day (BID) | ORAL | 0 refills | Status: AC

## 2019-04-09 NOTE — Telephone Encounter (Signed)
Patient advised.

## 2019-04-09 NOTE — Telephone Encounter (Signed)
Have sent refill for Cipro

## 2019-04-09 NOTE — Telephone Encounter (Signed)
Copied from [place] [id number]. Topic: General - Inquiry >> Apr 09, 2019  3:27 PM Alexander Mcbride wrote: Reason for CRM: pt called in stated that he was in the office 11/17 for Mcbride uti and he took his full round of meds.  He felt better while on the meds but as soon has he completed them , it went right back to all the symptoms.   Best number [phone] [facility] [address]

## 2019-04-09 NOTE — Addendum Note (Signed)
Addended by: Birdie Sons on: 04/09/2019 05:03 PM   Modules accepted: Orders

## 2019-04-17 ENCOUNTER — Other Ambulatory Visit: Payer: Self-pay | Admitting: Family Medicine

## 2019-04-17 DIAGNOSIS — M48062 Spinal stenosis, lumbar region with neurogenic claudication: Secondary | ICD-10-CM

## 2019-04-17 NOTE — Telephone Encounter (Signed)
Medication refill: oxyCODONE-acetaminophen (PERCOCET/ROXICET) 5-325 MG tablet [[id number]]     [facility]  [id number] [place] [place] (Phone) [phone] (Fax)    Pt aware of turn around time.

## 2019-04-18 MED ORDER — OXYCODONE-ACETAMINOPHEN 5-325 MG PO TABS: 1.0000 | ORAL_TABLET | Freq: Four times a day (QID) | ORAL | 0 refills | Status: DC | PRN

## 2019-05-21 ENCOUNTER — Other Ambulatory Visit: Payer: Self-pay | Admitting: Family Medicine

## 2019-05-21 DIAGNOSIS — M48062 Spinal stenosis, lumbar region with neurogenic claudication: Secondary | ICD-10-CM

## 2019-05-21 NOTE — Telephone Encounter (Signed)
Pt is requesting refill for oxyCODONE-acetaminophen (PERCOCET/ROXICET) 5-325 MG tablet    [facility]armacy:  [id number] [place], [place] - [address] Phone:  [phone]  Fax:  [phone]

## 2019-05-22 MED ORDER — OXYCODONE-ACETAMINOPHEN 5-325 MG PO TABS: 1.0000 | ORAL_TABLET | Freq: Four times a day (QID) | ORAL | 0 refills | Status: DC | PRN

## 2019-06-05 ENCOUNTER — Other Ambulatory Visit: Payer: Self-pay | Admitting: Family Medicine

## 2019-06-05 DIAGNOSIS — I10 Essential (primary) hypertension: Secondary | ICD-10-CM

## 2019-06-18 ENCOUNTER — Ambulatory Visit (INDEPENDENT_AMBULATORY_CARE_PROVIDER_SITE_OTHER): Payer: BC Managed Care – PPO | Admitting: Family Medicine

## 2019-06-18 ENCOUNTER — Ambulatory Visit: Payer: Self-pay | Admitting: Family Medicine

## 2019-06-18 ENCOUNTER — Other Ambulatory Visit: Payer: Self-pay

## 2019-06-18 ENCOUNTER — Encounter: Payer: Self-pay | Admitting: Family Medicine

## 2019-06-18 VITALS — BP 140/64 | HR 81 | Temp 97.1°F | Resp 16 | Wt 205.0 lb

## 2019-06-18 DIAGNOSIS — M47816 Spondylosis without myelopathy or radiculopathy, lumbar region: Secondary | ICD-10-CM

## 2019-06-18 DIAGNOSIS — M48062 Spinal stenosis, lumbar region with neurogenic claudication: Secondary | ICD-10-CM

## 2019-06-18 DIAGNOSIS — I10 Essential (primary) hypertension: Secondary | ICD-10-CM

## 2019-06-18 DIAGNOSIS — M533 Sacrococcygeal disorders, not elsewhere classified: Secondary | ICD-10-CM

## 2019-06-18 DIAGNOSIS — H6121 Impacted cerumen, right ear: Secondary | ICD-10-CM | POA: Diagnosis not present

## 2019-06-18 DIAGNOSIS — F5101 Primary insomnia: Secondary | ICD-10-CM | POA: Diagnosis not present

## 2019-06-18 DIAGNOSIS — Z1211 Encounter for screening for malignant neoplasm of colon: Secondary | ICD-10-CM

## 2019-06-18 MED ORDER — OXYCODONE-ACETAMINOPHEN 5-325 MG PO TABS: 1.0000 | ORAL_TABLET | Freq: Four times a day (QID) | ORAL | 0 refills | Status: DC | PRN

## 2019-06-18 NOTE — Progress Notes (Signed)
Patient: Alexander Mcbride Male    DOB: 10/20/1955   64 y.o.   MRN: [id number] Visit Date: 06/18/2019  Today's Provider: Lelon Huh, MD   Chief Complaint  Patient presents with  . Hypertension  . Insomnia   Subjective:     HPI  Hypertension, follow-up:  BP Readings from Last 3 Encounters:  06/18/19 140/64  03/26/19 (!) 180/79  03/12/19 (!) 148/80    He was last seen for hypertension 3 months ago.  BP at that visit was 148/80. Management since that visit includes added Amlodipine 5mg  daily. He reports good compliance with treatment. He is not having side effects.  He is exercising. He is not adherent to low salt diet.   Outside blood pressures are not checked. He is experiencing none.  Patient denies chest pain, chest pressure/discomfort, claudication, dyspnea, exertional chest pressure/discomfort, fatigue, irregular heart beat, lower extremity edema, near-syncope, orthopnea, palpitations, paroxysmal nocturnal dyspnea, syncope and tachypnea.   Cardiovascular risk factors include advanced age (older than 38 for men, 83 for women), hypertension and male gender.  Use of agents associated with hypertension: none.     Weight trend: stable Wt Readings from Last 3 Encounters:  06/18/19 205 lb (93 kg)  03/26/19 206 lb 6.4 oz (93.6 kg)  03/12/19 206 lb (93.4 kg)    Current diet: well balanced  ------------------------------------------------------------------------  Follow up for Insomnia:  The patient was last seen for this 3 months ago. Changes made at last visit include started trial of Trazodone.  He reports fair compliance with treatment. He feels that condition is Unchanged. He is having side effects. Trazodone makes his feel groggy in the mornings. Has since been taking melatonin 12mg  hs which works fairly well.    ------------------------------------------------------------------------------------  Follow up low back pain Continues to have  pain daily from spinal stenosis with neurogenic claudication. Takes oxycodone/apap on schedule which is effective, but does not completely alleviate pain. 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 Is tolerating pain medications well without adverse effects.   He also states his gets wax from ears frequently and feels congested, right more than left.    Allergies  Allergen Reactions  . Gabapentin Other (See Comments)    body aches  . Lisinopril Rash     Current Outpatient Medications:  .  amLODipine (NORVASC) 5 MG tablet, TAKE 1 TABLET BY MOUTH EVERY DAY IN THE EVENING, Disp: 90 tablet, Rfl: 0 .  losartan (COZAAR) 100 MG tablet, Take 1 tablet (100 mg total) by mouth daily., Disp: 90 tablet, Rfl: 1 .  oxyCODONE-acetaminophen (PERCOCET/ROXICET) 5-325 MG tablet, Take 1 tablet by mouth 4 (four) times daily as needed (back pain)., Disp: 120 tablet, Rfl: 0 .  traZODone (DESYREL) 100 MG tablet, TAKE 1-1.5 TABLETS (100-150 MG TOTAL) BY MOUTH AT BEDTIME., Disp: 135 tablet, Rfl: 4  Review of Systems  Constitutional: Negative for appetite change, chills and fever.  Respiratory: Negative for chest tightness, shortness of breath and wheezing.   Cardiovascular: Negative for chest pain and palpitations.  Gastrointestinal: Negative for abdominal pain, nausea and vomiting.    Social History   Tobacco Use  . Smoking status: Former Research scientist (life sciences)  . Smokeless tobacco: Never Used  Substance Use Topics  . Alcohol use: Yes    Alcohol/week: 0.0 standard drinks    Comment: occasionally      Objective:   BP 140/64 (BP Location: Right Arm, Cuff Size: Large)   Pulse 81  Temp (!) 97.1 F (36.2 C) (Temporal)   Resp 16   Wt 205 lb (93 kg)   SpO2 98% Comment: room air  BMI 32.11 kg/m  Vitals:   06/18/19 1527 06/18/19 1529  BP: 138/72 140/64  Pulse: 81   Resp: 16   Temp: (!) 97.1 F (36.2 C)   TempSrc: Temporal   SpO2: 98%   Weight: 205 lb (93 kg)   Body mass index is 32.11  kg/m.   Physical Exam  General appearance: Overweight male, cooperative and in no acute distress Head: Normocephalic, without obvious abnormality, atraumatic Respiratory: Respirations even and unlabored, normal respiratory rate Extremities: All extremities are intact.  ENT: Small amount cerumen in left ear canal. Right Canal obstructed by cerumen.  Psych: Appropriate mood and affect. Neurologic: Mental status: Alert, oriented to person, place, and time, thought content appropriate.      Assessment & Plan    1. Essential (primary) hypertension Fairly well controlled, Continue current medications.    2. Primary insomnia Off of trazodone which made him groggy in the morning. Doing fairly well with OTC- Melatonin 12 MG TABS; Take 12 mg by mouth at bedtime.  3. Spinal stenosis, lumbar region, with neurogenic claudication refill - oxyCODONE-acetaminophen (PERCOCET/ROXICET) 5-325 MG tablet; Take 1 tablet by mouth 4 (four) times daily as needed (back pain).  Dispense: 120 tablet; Refill: 0  4. Right ear cerumen impaction After soaking with Debrox, ear canal was irrigated with water until clear. Patient tolerated procedure well.    5. Sacroiliac joint dysfunction  - oxyCODONE-acetaminophen (PERCOCET/ROXICET) 5-325 MG tablet; Take 1 tablet by mouth 4 (four) times daily as needed (back pain).  Dispense: 120 tablet; Refill: 0  6. Facet syndrome, lumbar  - oxyCODONE-acetaminophen (PERCOCET/ROXICET) 5-325 MG tablet; Take 1 tablet by mouth 4 (four) times daily as needed (back pain).  Dispense: 120 tablet; Refill: 0  7. Colon cancer screening  - Cologuard  Follow up 6 months BP check and labs.     [name], MD  [facility]

## 2019-07-22 ENCOUNTER — Other Ambulatory Visit: Payer: Self-pay | Admitting: Family Medicine

## 2019-07-22 ENCOUNTER — Ambulatory Visit: Payer: No Typology Code available for payment source | Attending: Family Medicine

## 2019-07-22 DIAGNOSIS — M533 Sacrococcygeal disorders, not elsewhere classified: Secondary | ICD-10-CM

## 2019-07-22 DIAGNOSIS — M48062 Spinal stenosis, lumbar region with neurogenic claudication: Secondary | ICD-10-CM

## 2019-07-22 DIAGNOSIS — M47816 Spondylosis without myelopathy or radiculopathy, lumbar region: Secondary | ICD-10-CM

## 2019-07-22 NOTE — Telephone Encounter (Signed)
Patient requesting oxyCODONE-acetaminophen (PERCOCET/ROXICET) 5-325 MG tablet, informed please allow 72 hour turn around time  [id number] [place], [place] - [address] Phone:  [phone]  Fax:  [phone]

## 2019-07-26 MED ORDER — OXYCODONE-ACETAMINOPHEN 5-325 MG PO TABS: 1.0000 | ORAL_TABLET | Freq: Four times a day (QID) | ORAL | 0 refills | Status: DC | PRN

## 2019-07-26 NOTE — Telephone Encounter (Signed)
Patient stated he is completely out of medication and would like his request expedited.

## 2019-07-26 NOTE — Telephone Encounter (Signed)
Requested medication (s) are due for refill today -yes  Requested medication (s) are on the active medication list -yes  Future visit scheduled -yes  Last refill: 06/18/19  Notes to clinic: Patient requesting non delegated Rx  Requested Prescriptions  Pending Prescriptions Disp Refills   oxyCODONE-acetaminophen (PERCOCET/ROXICET) 5-325 MG tablet 120 tablet 0    Sig: Take 1 tablet by mouth 4 (four) times daily as needed (back pain).      Not Delegated - Analgesics:  Opioid Agonist Combinations Failed - 07/26/2019 11:20 AM      Failed - This refill cannot be delegated      Failed - Urine Drug Screen completed in last 360 days.      Passed - Valid encounter within last 6 months    Recent Outpatient Visits           1 month ago Essential (primary) hypertension   [facility], Demetrios Isaacs, MD   4 months ago Dysuria   [facility] Chrismon, Jodell Cipro, [place]   4 months ago Essential (primary) hypertension   [facility] Malva Limes, [place]   7 months ago Sore throat   [facility] Maple Hudson., MD   9 months ago Essential (primary) hypertension   [facility] Malva Limes, [facility]       Future Appointments             In 4 months Fisher, Demetrios Isaacs, MD [facility]                Requested Prescriptions  Pending Prescriptions Disp Refills   oxyCODONE-acetaminophen (PERCOCET/ROXICET) 5-325 MG tablet 120 tablet 0    Sig: Take 1 tablet by mouth 4 (four) times daily as needed (back pain).      Not Delegated - Analgesics:  Opioid Agonist Combinations Failed - 07/26/2019 11:20 AM      Failed - This refill cannot be delegated      Failed - Urine Drug Screen completed in last 360 days.      Passed - Valid encounter within last 6 months    Recent Outpatient Visits           1 month ago Essential (primary) hypertension   [facility] Fisher, Demetrios Isaacs, MD   4  months ago Dysuria   [facility] Chrismon, Jodell Cipro, [place]   4 months ago Essential (primary) hypertension   [facility] Malva Limes, MD   7 months ago Sore throat   [facility] Maple Hudson., MD   9 months ago Essential (primary) hypertension   [facility] Fisher, Demetrios Isaacs, MD       Future Appointments             In 4 months Fisher, Demetrios Isaacs, MD [facility]

## 2019-07-26 NOTE — Telephone Encounter (Signed)
LOV   06/18/19 LRF   06/18/19  # 120

## 2019-08-26 ENCOUNTER — Other Ambulatory Visit: Payer: Self-pay | Admitting: Family Medicine

## 2019-08-26 DIAGNOSIS — M533 Sacrococcygeal disorders, not elsewhere classified: Secondary | ICD-10-CM

## 2019-08-26 DIAGNOSIS — M47816 Spondylosis without myelopathy or radiculopathy, lumbar region: Secondary | ICD-10-CM

## 2019-08-26 DIAGNOSIS — M48062 Spinal stenosis, lumbar region with neurogenic claudication: Secondary | ICD-10-CM

## 2019-08-26 NOTE — Telephone Encounter (Signed)
Medication Refill - Medication: Oxycodone 5/325  Has the patient contacted their pharmacy? No. (Agent: If no, request that the patient contact the pharmacy for the refill.) (Agent: If yes, when and what did the pharmacy advise?)  Preferred Pharm[facility] (with phone number [facility] [address]  Agent: Please be advised that RX refills may take up to 3 business days. We ask that you follow-up with your pharmacy.

## 2019-08-26 NOTE — Telephone Encounter (Signed)
Requested medication (s) are due for refill today: yes  Requested medication (s) are on the active medication list: yes  Last refill:  07/26/19  Future visit scheduled: yes  Notes to clinic:  not delegated   Requested Prescriptions  Pending Prescriptions Disp Refills   oxyCODONE-acetaminophen (PERCOCET/ROXICET) 5-325 MG tablet 120 tablet 0    Sig: Take 1 tablet by mouth 4 (four) times daily as needed (back pain).      Not Delegated - Analgesics:  Opioid Agonist Combinations Failed - 08/26/2019 11:36 AM      Failed - This refill cannot be delegated      Failed - Urine Drug Screen completed in last 360 days.      Passed - Valid encounter within last 6 months    Recent Outpatient Visits           2 months ago Essential (primary) hypertension   [facility] Malva Limes, MD   5 months ago Dysuria   [facility] Chrismon, Jodell Cipro, [place]   5 months ago Essential (primary) hypertension   [facility] Malva Limes, MD   8 months ago Sore throat   [facility] Maple Hudson., MD   10 months ago Essential (primary) hypertension   [facility] Fisher, Demetrios Isaacs, MD       Future Appointments             In 3 months Fisher, Demetrios Isaacs, MD [facility]

## 2019-08-27 MED ORDER — OXYCODONE-ACETAMINOPHEN 5-325 MG PO TABS: 1.0000 | ORAL_TABLET | Freq: Four times a day (QID) | ORAL | 0 refills | Status: DC | PRN

## 2019-09-01 ENCOUNTER — Other Ambulatory Visit: Payer: Self-pay | Admitting: Family Medicine

## 2019-09-01 DIAGNOSIS — I10 Essential (primary) hypertension: Secondary | ICD-10-CM

## 2019-09-01 NOTE — Telephone Encounter (Signed)
Requested Prescriptions  Pending Prescriptions Disp Refills  . amLODipine (NORVASC) 5 MG tablet [Pharmacy Med Name: AMLODIPINE BESYLATE 5 MG TAB] 90 tablet 1    Sig: TAKE 1 TABLET BY MOUTH EVERY DAY IN THE EVENING     Cardiovascular:  Calcium Channel Blockers Failed - 09/01/2019 10:01 AM      Failed - Last BP in normal range    BP Readings from Last 1 Encounters:  06/18/19 140/64         Passed - Valid encounter within last 6 months    Recent Outpatient Visits          2 months ago Essential (primary) hypertension   [facility] Malva Limes, MD   5 months ago Dysuria   [facility] [place], [place]   5 months ago Essential (primary) hypertension   [facility] Malva Limes, MD   8 months ago Sore throat   [facility] Maple Hudson., MD   10 months ago Essential (primary) hypertension   [facility] Fisher, Demetrios Isaacs, MD      Future Appointments            In 3 months Fisher, Demetrios Isaacs, MD [facility]

## 2019-09-25 ENCOUNTER — Other Ambulatory Visit: Payer: Self-pay | Admitting: Family Medicine

## 2019-09-25 DIAGNOSIS — M48062 Spinal stenosis, lumbar region with neurogenic claudication: Secondary | ICD-10-CM

## 2019-09-25 DIAGNOSIS — M47816 Spondylosis without myelopathy or radiculopathy, lumbar region: Secondary | ICD-10-CM

## 2019-09-25 DIAGNOSIS — M533 Sacrococcygeal disorders, not elsewhere classified: Secondary | ICD-10-CM

## 2019-09-25 NOTE — Telephone Encounter (Signed)
Requested medication (s) are due for refill today: Yes  Requested medication (s) are on the active medication list: Yes  Last refill:  08/27/19  Future visit scheduled: Yes  Notes to clinic:  Unable to refill per protocol, cannot delegate.     Requested Prescriptions  Pending Prescriptions Disp Refills   oxyCODONE-acetaminophen (PERCOCET/ROXICET) 5-325 MG tablet 120 tablet 0    Sig: Take 1 tablet by mouth 4 (four) times daily as needed (back pain).      Not Delegated - Analgesics:  Opioid Agonist Combinations Failed - 09/25/2019  3:44 PM      Failed - This refill cannot be delegated      Failed - Urine Drug Screen completed in last 360 days.      Passed - Valid encounter within last 6 months    Recent Outpatient Visits           3 months ago Essential (primary) hypertension   [facility] Malva Limes, MD   6 months ago Dysuria   [facility] Chrismon, Jodell Cipro, [place]   6 months ago Essential (primary) hypertension   [facility] Malva Limes, MD   9 months ago Sore throat   [facility] Maple Hudson., MD   11 months ago Essential (primary) hypertension   [facility] Fisher, Demetrios Isaacs, MD       Future Appointments             In 2 months Fisher, Demetrios Isaacs, MD [facility]

## 2019-09-25 NOTE — Telephone Encounter (Signed)
PT need a refill oxyCODONE-acetaminophen (PERCOCET/ROXICET) 5-325 MG tablet [[id number]]  [id number] [place], [place] - [address]

## 2019-09-25 NOTE — Addendum Note (Signed)
Addended by: Wilford Corner on: 09/25/2019 03:44 PM   Modules accepted: Orders

## 2019-09-26 MED ORDER — OXYCODONE-ACETAMINOPHEN 5-325 MG PO TABS: 1.0000 | ORAL_TABLET | Freq: Four times a day (QID) | ORAL | 0 refills | Status: DC | PRN

## 2019-11-04 ENCOUNTER — Other Ambulatory Visit: Payer: Self-pay | Admitting: Family Medicine

## 2019-11-04 DIAGNOSIS — M47816 Spondylosis without myelopathy or radiculopathy, lumbar region: Secondary | ICD-10-CM

## 2019-11-04 DIAGNOSIS — M533 Sacrococcygeal disorders, not elsewhere classified: Secondary | ICD-10-CM

## 2019-11-04 DIAGNOSIS — M48062 Spinal stenosis, lumbar region with neurogenic claudication: Secondary | ICD-10-CM

## 2019-11-04 MED ORDER — OXYCODONE-ACETAMINOPHEN 5-325 MG PO TABS: 1.0000 | ORAL_TABLET | Freq: Four times a day (QID) | ORAL | 0 refills | Status: DC | PRN

## 2019-11-04 NOTE — Telephone Encounter (Signed)
Requested medication (s) are due for refill today: yes  Requested medication (s) are on the active medication list: yes  Last refill: 09/26/19  Future visit scheduled: yes  Notes to clinic:  not delegated    Requested Prescriptions  Pending Prescriptions Disp Refills   oxyCODONE-acetaminophen (PERCOCET/ROXICET) 5-325 MG tablet 120 tablet 0    Sig: Take 1 tablet by mouth 4 (four) times daily as needed (back pain).      Not Delegated - Analgesics:  Opioid Agonist Combinations Failed - 11/04/2019  9:17 AM      Failed - This refill cannot be delegated      Failed - Urine Drug Screen completed in last 360 days.      Passed - Valid encounter within last 6 months    Recent Outpatient Visits           4 months ago Essential (primary) hypertension   [facility] Malva Limes, MD   7 months ago Dysuria   [facility] Chrismon, Jodell Cipro, [place]   7 months ago Essential (primary) hypertension   [facility] Malva Limes, MD   10 months ago Sore throat   [facility] Maple Hudson., MD   1 year ago Essential (primary) hypertension   [facility] Malva Limes, MD       Future Appointments             In 1 month Fisher, Demetrios Isaacs, MD [facility]

## 2019-11-04 NOTE — Telephone Encounter (Signed)
Copied from CRM [id number]. Topic: Quick Communication - Rx Refill/Question >> Nov 04, 2019  8:54 AM Jaquita Rector A wrote: Medication: oxyCODONE-acetaminophen (PERCOCET/ROXICET) 5-325 MG tablet    Has the patient contacted their pharmacy? Yes.   (Agent: If no, request that the patient contact the pharmacy for the refill.) (Agent: If yes, when and what did the pharmacy advise?)  Preferred Pharmacy (with phone number or street name): [id number] [place] [address]  Phone:  [phone] Fax:  [phone]     Agent: Please be advised that RX refills may take up to 3 business days. We ask that you follow-up with your pharmacy.

## 2019-11-16 ENCOUNTER — Other Ambulatory Visit: Payer: Self-pay | Admitting: Family Medicine

## 2019-11-16 DIAGNOSIS — I10 Essential (primary) hypertension: Secondary | ICD-10-CM

## 2019-11-16 NOTE — Telephone Encounter (Signed)
Requested Prescriptions  Pending Prescriptions Disp Refills  . losartan (COZAAR) 100 MG tablet [Pharmacy Med Name: LOSARTAN POTASSIUM 100 MG TAB] 90 tablet 0    Sig: TAKE 1 TABLET BY MOUTH EVERY DAY     Cardiovascular:  Angiotensin Receptor Blockers Failed - 11/16/2019  9:01 AM      Failed - Cr in normal range and within 180 days    Creatinine, Ser  Date Value Ref Range Status  10/29/2018 1.03 0.76 - 1.27 mg/dL Final         Failed - K in normal range and within 180 days    Potassium  Date Value Ref Range Status  10/29/2018 4.0 3.5 - 5.2 mmol/L Final         Failed - Last BP in normal range    BP Readings from Last 1 Encounters:  06/18/19 140/64         Passed - Patient is not pregnant      Passed - Valid encounter within last 6 months    Recent Outpatient Visits          5 months ago Essential (primary) hypertension   [facility] Malva Limes, MD   7 months ago Dysuria   [facility] [place], [place]   8 months ago Essential (primary) hypertension   [facility] Malva Limes, MD   10 months ago Sore throat   [facility] Maple Hudson., MD   1 year ago Essential (primary) hypertension   [facility] Fisher, Demetrios Isaacs, MD      Future Appointments            In 1 month Fisher, Demetrios Isaacs, MD [facility]

## 2019-12-03 ENCOUNTER — Other Ambulatory Visit: Payer: Self-pay | Admitting: Family Medicine

## 2019-12-03 DIAGNOSIS — M47816 Spondylosis without myelopathy or radiculopathy, lumbar region: Secondary | ICD-10-CM

## 2019-12-03 DIAGNOSIS — M533 Sacrococcygeal disorders, not elsewhere classified: Secondary | ICD-10-CM

## 2019-12-03 DIAGNOSIS — M48062 Spinal stenosis, lumbar region with neurogenic claudication: Secondary | ICD-10-CM

## 2019-12-03 NOTE — Telephone Encounter (Signed)
PT need a refill oxyCODONE-acetaminophen (PERCOCET/ROXICET) 5-325 MG tablet [[id number]] [id number] Hassell Halim [address]  [address] [place] [address]  Phone: [phone] Fax: [phone]

## 2019-12-03 NOTE — Addendum Note (Signed)
Addended by: Lisabeth Pick on: 12/03/2019 03:06 PM   Modules accepted: Orders

## 2019-12-03 NOTE — Telephone Encounter (Signed)
Requested medication (s) are due for refill today: yes  Requested medication (s) are on the active medication list: yes  Last refill:  11/04/2019  Future visit scheduled: yes  Notes to clinic:  this refill cannot be delegated    Requested Prescriptions  Pending Prescriptions Disp Refills   oxyCODONE-acetaminophen (PERCOCET/ROXICET) 5-325 MG tablet 120 tablet 0    Sig: Take 1 tablet by mouth 4 (four) times daily as needed (back pain).      Not Delegated - Analgesics:  Opioid Agonist Combinations Failed - 12/03/2019  3:06 PM      Failed - This refill cannot be delegated      Failed - Urine Drug Screen completed in last 360 days.      Passed - Valid encounter within last 6 months    Recent Outpatient Visits           5 months ago Essential (primary) hypertension   [facility] Malva Limes, MD   8 months ago Dysuria   [facility] Chrismon, Jodell C[place], [place]   8 months ago Essential (primary) hypertension   [facility] Malva Limes, MD   11 months ago Sore throat   [facility] Maple Hudson., MD   1 year ago Essential (primary) hypertension   [facility] Malva Limes, MD       Future Appointments             In 2 weeks Fisher, Demetrios Isaacs, MD [facility], [facility]

## 2019-12-04 MED ORDER — OXYCODONE-ACETAMINOPHEN 5-325 MG PO TABS: 1.0000 | ORAL_TABLET | Freq: Four times a day (QID) | ORAL | 0 refills | Status: DC | PRN

## 2019-12-19 DIAGNOSIS — R030 Elevated blood-pressure reading, without diagnosis of hypertension: Secondary | ICD-10-CM | POA: Diagnosis not present

## 2019-12-19 DIAGNOSIS — Z20828 Contact with and (suspected) exposure to other viral communicable diseases: Secondary | ICD-10-CM | POA: Diagnosis not present

## 2019-12-20 ENCOUNTER — Ambulatory Visit: Payer: Self-pay | Admitting: Family Medicine

## 2019-12-31 ENCOUNTER — Ambulatory Visit: Payer: Self-pay | Admitting: Family Medicine

## 2020-01-06 ENCOUNTER — Other Ambulatory Visit: Payer: Self-pay | Admitting: Family Medicine

## 2020-01-06 DIAGNOSIS — M48062 Spinal stenosis, lumbar region with neurogenic claudication: Secondary | ICD-10-CM

## 2020-01-06 DIAGNOSIS — M47816 Spondylosis without myelopathy or radiculopathy, lumbar region: Secondary | ICD-10-CM

## 2020-01-06 DIAGNOSIS — M533 Sacrococcygeal disorders, not elsewhere classified: Secondary | ICD-10-CM

## 2020-01-06 MED ORDER — OXYCODONE-ACETAMINOPHEN 5-325 MG PO TABS: 1.0000 | ORAL_TABLET | Freq: Four times a day (QID) | ORAL | 0 refills | Status: DC | PRN

## 2020-01-06 NOTE — Telephone Encounter (Signed)
Requested medication (s) are due for refill today -yes  Requested medication (s) are on the active medication list -yes  Future visit scheduled -yes  Last refill: 12/04/19  Notes to clinic: Request for non delegated Rx  Requested Prescriptions  Pending Prescriptions Disp Refills   oxyCODONE-acetaminophen (PERCOCET/ROXICET) 5-325 MG tablet 120 tablet 0    Sig: Take 1 tablet by mouth 4 (four) times daily as needed (back pain).      Not Delegated - Analgesics:  Opioid Agonist Combinations Failed - 01/06/2020  3:33 PM      Failed - This refill cannot be delegated      Failed - Urine Drug Screen completed in last 360 days.      Failed - Valid encounter within last 6 months    Recent Outpatient Visits           6 months ago Essential (primary) hypertension   [facility] Malva Limes, MD   9 months ago Dysuria   [facility] Chrismon, Jodell [place]ipro, [place]   10 months ago Essential (primary) hypertension   [facility] Malva Limes, MD   1 year ago Sore throat   [facility] Maple Hudson., MD   1 year ago Essential (primary) hypertension   [facility] Malva Limes, MD       Future Appointments             In 3 weeks Fisher, Demetrios Isaacs, MD [facility]                Requested Prescriptions  Pending Prescriptions Disp Refills   oxyCODONE-acetaminophen (PERCOCET/ROXICET) 5-325 MG tablet 120 tablet 0    Sig: Take 1 tablet by mouth 4 (four) times daily as needed (back pain).      Not Delegated - Analgesics:  Opioid Agonist Combinations Failed - 01/06/2020  3:33 PM      Failed - This refill cannot be delegated      Failed - Urine Drug Screen completed in last 360 days.      Failed - Valid encounter within last 6 months    Recent Outpatient Visits           6 months ago Essential (primary) hypertension   [facility] Malva Limes, MD   9 months ago  Dysuria   [facility] Chrismon, Jodell [place], [place]   10 months ago Essential (primary) hypertension   [facility] Malva Limes, MD   1 year ago Sore throat   [facility] Maple Hudson., MD   1 year ago Essential (primary) hypertension   [facility] Malva Limes, MD       Future Appointments             In 3 weeks Fisher, Demetrios Isaacs, MD [facility], [facility]

## 2020-01-06 NOTE — Telephone Encounter (Signed)
Medication Refill - Medication: oxyCODONE-acetaminophen (PERCOCET/ROXICET) 5-325 MG tablet   Has the patient contacted their pharmacy? Yes.   (Agent: If no, request that the patient contact the pharmacy for the refill.) (Agent: If yes, when and what did the pharmacy advise?)  Preferred Pharmacy (with phone number or street name):  [facility] [phone] Hassell Halim [address]  [address] [place] [address]  Phone: [phone] Fax: [phone]     Agent: Please be advised that RX refills may take up to 3 business days. We ask that you follow-up with your pharmacy.

## 2020-01-28 ENCOUNTER — Ambulatory Visit: Payer: BC Managed Care – PPO | Admitting: Family Medicine

## 2020-02-04 ENCOUNTER — Other Ambulatory Visit: Payer: Self-pay | Admitting: Family Medicine

## 2020-02-04 DIAGNOSIS — M48062 Spinal stenosis, lumbar region with neurogenic claudication: Secondary | ICD-10-CM

## 2020-02-04 DIAGNOSIS — M47816 Spondylosis without myelopathy or radiculopathy, lumbar region: Secondary | ICD-10-CM

## 2020-02-04 DIAGNOSIS — M533 Sacrococcygeal disorders, not elsewhere classified: Secondary | ICD-10-CM

## 2020-02-04 NOTE — Telephone Encounter (Signed)
Medication Refill - Medication: oxyCODONE-acetaminophen (PERCOCET/ROXICET) 5-325 MG tablet     Preferred Pharmacy (with phone number or street name):  [id number] [place] [place] - [address] Phone:  [phone]  Fax:  [phone]       Agent: Please be advised that RX refills may take up to 3 business days. We ask that you follow-up with your pharmacy.  

## 2020-02-04 NOTE — Telephone Encounter (Signed)
Requested medication (s) are due for refill today- unknown  Requested medication (s) are on the active medication list -yes  Future visit scheduled -yes  Last refill: 01/06/20  Notes to clinic: Request for non delegated Rx  Requested Prescriptions  Pending Prescriptions Disp Refills   oxyCODONE-acetaminophen (PERCOCET/ROXICET) 5-325 MG tablet 120 tablet 0    Sig: Take 1 tablet by mouth 4 (four) times daily as needed (back pain).      Not Delegated - Analgesics:  Opioid Agonist Combinations Failed - 02/04/2020  9:22 AM      Failed - This refill cannot be delegated      Failed - Urine Drug Screen completed in last 360 days.      Failed - Valid encounter within last 6 months    Recent Outpatient Visits           7 months ago Essential (primary) hypertension   [facility] Fisher, Demetrios Isaacs, MD   10 months ago Dysuria   [facility] Chrismon, Jodell [place], [place]   10 months ago Essential (primary) hypertension   [facility] Malva Limes, MD   1 year ago Sore throat   [facility] Maple Hudson., MD   1 year ago Essential (primary) hypertension   [facility] Malva Limes, MD       Future Appointments             In 3 weeks Fisher, Demetrios Isaacs, MD [facility]                Requested Prescriptions  Pending Prescriptions Disp Refills   oxyCODONE-acetaminophen (PERCOCET/ROXICET) 5-325 MG tablet 120 tablet 0    Sig: Take 1 tablet by mouth 4 (four) times daily as needed (back pain).      Not Delegated - Analgesics:  Opioid Agonist Combinations Failed - 02/04/2020  9:22 AM      Failed - This refill cannot be delegated      Failed - Urine Drug Screen completed in last 360 days.      Failed - Valid encounter within last 6 months    Recent Outpatient Visits           7 months ago Essential (primary) hypertension   [facility] Malva Limes, MD   10  months ago Dysuria   [facility] Chrismon, Jodell Cipro, [place]   10 months ago Essential (primary) hypertension   [facility] Malva Limes, MD   1 year ago Sore throat   [facility] Maple Hudson., MD   1 year ago Essential (primary) hypertension   [facility] Malva Limes, MD       Future Appointments             In 3 weeks Fisher, Demetrios Isaacs, MD [facility]

## 2020-02-04 NOTE — Telephone Encounter (Signed)
Patient overdue for office visit. Needs to schedule before refill can be approved.

## 2020-02-07 MED ORDER — OXYCODONE-ACETAMINOPHEN 5-325 MG PO TABS: 1.0000 | ORAL_TABLET | Freq: Four times a day (QID) | ORAL | 0 refills | Status: DC | PRN

## 2020-02-07 NOTE — Telephone Encounter (Signed)
Patient has office visit on 10/19.

## 2020-02-19 ENCOUNTER — Other Ambulatory Visit: Payer: Self-pay | Admitting: Family Medicine

## 2020-02-19 DIAGNOSIS — I10 Essential (primary) hypertension: Secondary | ICD-10-CM

## 2020-02-19 NOTE — Telephone Encounter (Signed)
Requested medications are due for refill today?  Yes  Requested medications are on active medication list?  Yes   Last Refill:  11/16/2019  # 90 with no refills   Future visit scheduled?  Yes in 6 days   Notes to Clinic:  Medication failed Rx refill protocol due to no valid encounter in the past 6 months and no labs within the past 180 days,  Last office visit was 8 months ago.  Last labs were performed on 10/29/2018.

## 2020-02-24 NOTE — Progress Notes (Addendum)
Established patient visit   Patient: Alexander Mcbride   DOB: 01/28/1956   64 y.o. Male  MRN: [id number] Visit Date: 02/25/2020  Today's healthcare provider: [name], MD   Chief Complaint  Patient presents with  . Hypertension  . Insomnia   Subjective    HPI  Hypertension, follow-up  BP Readings from Last 3 Encounters:  02/25/20 134/75  06/18/19 140/64  03/26/19 (!) 180/79   Wt Readings from Last 3 Encounters:  02/25/20 195 lb (88.5 kg)  06/18/19 205 lb (93 kg)  03/26/19 206 lb 6.4 oz (93.6 kg)     He was last seen for hypertension 8 months ago.  BP at that visit was 140/64. Management since that visit includes no change.  He reports good compliance with treatment. He is not having side effects.  He is following a Regular diet. He is exercising. He does not smoke.  Use of agents associated with hypertension: none.   Outside blood pressures are not checked. Symptoms: No chest pain No chest pressure  No palpitations No syncope  No dyspnea No orthopnea  No paroxysmal nocturnal dyspnea No lower extremity edema   Pertinent labs: Lab Results  Component Value Date   CHOL 207 (H) 10/29/2018   HDL 42 10/29/2018   LDLCALC 107 (H) 10/29/2018   TRIG 288 (H) 10/29/2018   CHOLHDL 4.9 10/29/2018   Lab Results  Component Value Date   NA 139 10/29/2018   K 4.0 10/29/2018   CREATININE 1.03 10/29/2018   GFRNONAA 78 10/29/2018   GFRAA 90 10/29/2018   GLUCOSE 111 (H) 10/29/2018     The 10-year ASCVD risk score Denman George DC Jr., et al., 2013) is: 14.6%   --------------------------------------------------------------------------------------------------- Follow up for Insomnia:   The patient was last seen for this 6 months ago. Changes made at last visit include continue OTC Melatonin 12 mg.  He reports good compliance with treatment. Patient currently takes OTC Melatonin gummies. He feels that condition is Improved. He is not having side effects.    ----------------------------------------------------------------------------------------- He also complains of both his ears feeling clogged up and having difficulty hearing  He also reports that he is having more trouble with pain in his feet. oxycodone/apap is effective but wears off after 4-5 hours limiting his activity. He does have chronic back pain, but that is not bothering him much on current pain medication regiment. He was seen years ago by Dr. Alberteen Spindle for foot pain.   He also reports he pulled a muscle in his back a few weeks ago and has been applying heat which make it feel better for awhile, but it doesn't seem to be improving over time.       Medications: Outpatient Medications Prior to Visit  Medication Sig  . amLODipine (NORVASC) 5 MG tablet TAKE 1 TABLET BY MOUTH EVERY DAY IN THE EVENING  . losartan (COZAAR) 100 MG tablet TAKE 1 TABLET BY MOUTH EVERY DAY  . Melatonin 10 MG TABS Take 1 Dose by mouth at bedtime as needed. gummies  . oxyCODONE-acetaminophen (PERCOCET/ROXICET) 5-325 MG tablet Take 1 tablet by mouth 4 (four) times daily as needed (back pain).  . Melatonin 12 MG TABS Take 12 mg by mouth at bedtime. (Patient not taking: Reported on 02/25/2020)   No facility-administered medications prior to visit.    Review of Systems  Constitutional: Negative.   Respiratory: Negative.   Cardiovascular: Negative.   Musculoskeletal: Negative.   Psychiatric/Behavioral: Negative.       Objective  BP 134/75 (BP Location: Right Arm, Patient Position: Sitting, Cuff Size: Large)   Pulse 71   Temp 98.3 F (36.8 C) (Oral)   Resp 18   Ht 5\' 7"  (1.702 m)   Wt 195 lb (88.5 kg)   BMI 30.54 kg/m    Physical Exam   General: Appearance:    Obese male in no acute distress  Eyes:    PERRL, conjunctiva/corneas clear, EOM's intact       Ears:   Bilateral cerumen impaction  Lungs:     Clear to auscultation bilaterally, respirations unlabored  Heart:    Normal heart rate.  Normal rhythm. No murmurs, rubs, or gallops.   MS:   All extremities are intact.  Swelling of upper para-thoracic muscles right of midline.   Neurologic:   Awake, alert, oriented x 3. No apparent focal neurological           defect.          Assessment & Plan     1. Essential (primary) hypertension Well controlled.  Continue current medications.   - Comprehensive metabolic panel - Lipid panel  2. Primary insomnia Doing well with melatonin hs.   3. Bilateral impacted cerumen Cerumen impaction both ears   4. Spinal stenosis, lumbar region, with neurogenic claudication   5. Sacroiliac joint dysfunction   6. Facet syndrome, lumbar Lumbar back pain is pretty well controlled, although he does have some parathoracic muscle swelling. Advised to continue regular heat application and he may want to try an OTC electrical stimulator.   7. Chronic foot pain, left Has been having more trouble controlling pain lately and oxycodone/apap wearing off before next dose is due. Has not seen Dr. for several years.  - Ambulatory referral to Podiatry  Will allow him to increase oxycodone/apap to every four hours for the time being, but hopefully Dr. Alberteen Spindle will be able to help with long term treatment.   Counseled regarding opioid prescribing policies. reviewed opioid treatment contract, advised he will need to periodically provide urine specimen for UDS.   8. Colon cancer screening  - Cologuard  9. Need for hepatitis C screening test  - Hepatitis C antibody  10. Prostate cancer screening  - PSA Total (Reflex To Free) ([facility] only)  11. Need for influenza vaccination  - Flu Vaccine QUAD 36+ mos IM (Fluarix/Fluzone)  Addressed extensive list of chronic and acute medical problems today requiring 45 minutes reviewing his medical record, counseling patient regarding his conditions and coordination of care.            The entirety of the information documented in the History of  Present Illness, Review of Systems and Physical Exam were personally obtained by me. Portions of this information were initially documented by the CMA and reviewed by me for thoroughness and accuracy.      Alberteen Spindle, MD  [facility] [phone] (phone) [phone] (fax)  [facility]

## 2020-02-25 ENCOUNTER — Other Ambulatory Visit: Payer: Self-pay

## 2020-02-25 ENCOUNTER — Encounter: Payer: Self-pay | Admitting: Family Medicine

## 2020-02-25 ENCOUNTER — Ambulatory Visit: Payer: BC Managed Care – PPO | Admitting: Family Medicine

## 2020-02-25 VITALS — BP 134/75 | HR 71 | Temp 98.3°F | Resp 18 | Ht 67.0 in | Wt 195.0 lb

## 2020-02-25 DIAGNOSIS — Z23 Encounter for immunization: Secondary | ICD-10-CM | POA: Diagnosis not present

## 2020-02-25 DIAGNOSIS — H6123 Impacted cerumen, bilateral: Secondary | ICD-10-CM

## 2020-02-25 DIAGNOSIS — M533 Sacrococcygeal disorders, not elsewhere classified: Secondary | ICD-10-CM

## 2020-02-25 DIAGNOSIS — Z1159 Encounter for screening for other viral diseases: Secondary | ICD-10-CM

## 2020-02-25 DIAGNOSIS — F1190 Opioid use, unspecified, uncomplicated: Secondary | ICD-10-CM

## 2020-02-25 DIAGNOSIS — I10 Essential (primary) hypertension: Secondary | ICD-10-CM

## 2020-02-25 DIAGNOSIS — F5101 Primary insomnia: Secondary | ICD-10-CM

## 2020-02-25 DIAGNOSIS — M47816 Spondylosis without myelopathy or radiculopathy, lumbar region: Secondary | ICD-10-CM

## 2020-02-25 DIAGNOSIS — M79672 Pain in left foot: Secondary | ICD-10-CM

## 2020-02-25 DIAGNOSIS — Z1211 Encounter for screening for malignant neoplasm of colon: Secondary | ICD-10-CM

## 2020-02-25 DIAGNOSIS — G8929 Other chronic pain: Secondary | ICD-10-CM

## 2020-02-25 DIAGNOSIS — M48062 Spinal stenosis, lumbar region with neurogenic claudication: Secondary | ICD-10-CM | POA: Diagnosis not present

## 2020-02-25 DIAGNOSIS — Z125 Encounter for screening for malignant neoplasm of prostate: Secondary | ICD-10-CM

## 2020-02-25 MED ORDER — OXYCODONE-ACETAMINOPHEN 5-325 MG PO TABS: 1.0000 | ORAL_TABLET | ORAL | Status: DC | PRN

## 2020-02-25 NOTE — Patient Instructions (Addendum)
. Please review the attached list of medications and notify my office if there are any errors.    DASH Eating Plan DASH stands for "Dietary Approaches to Stop Hypertension." The DASH eating plan is a healthy eating plan that has been shown to reduce high blood pressure (hypertension). It may also reduce your risk for type 2 diabetes, heart disease, and stroke. The DASH eating plan may also help with weight loss. What are tips for following this plan?  General guidelines  Avoid eating more than 2,300 mg (milligrams) of salt (sodium) a day. If you have hypertension, you may need to reduce your sodium intake to 1,500 mg a day.  Limit alcohol intake to no more than 1 drink a day for nonpregnant women and 2 drinks a day for men. One drink equals 12 oz of beer, 5 oz of wine, or 1 oz of hard liquor.  Work with your health care provider to maintain a healthy body weight or to lose weight. Ask what an ideal weight is for you.  Get at least 30 minutes of exercise that causes your heart to beat faster (aerobic exercise) most days of the week. Activities may include walking, swimming, or biking.  Work with your health care provider or diet and nutrition specialist (dietitian) to adjust your eating plan to your individual calorie needs. Reading food labels   Check food labels for the amount of sodium per serving. Choose foods with less than 5 percent of the Daily Value of sodium. Generally, foods with less than 300 mg of sodium per serving fit into this eating plan.  To find whole grains, look for the word "whole" as the first word in the ingredient list. Shopping  Buy products labeled as "low-sodium" or "no salt added."  Buy fresh foods. Avoid canned foods and premade or frozen meals. Cooking  Avoid adding salt when cooking. Use salt-free seasonings or herbs instead of table salt or sea salt. Check with your health care provider or pharmacist before using salt substitutes.  Do not fry foods.  Cook foods using healthy methods such as baking, boiling, grilling, and broiling instead.  Cook with heart-healthy oils, such as olive, canola, soybean, or sunflower oil. Meal planning  Eat a balanced diet that includes: ? 5 or more servings of fruits and vegetables each day. At each meal, try to fill half of your plate with fruits and vegetables. ? Up to 6-8 servings of whole grains each day. ? Less than 6 oz of lean meat, poultry, or fish each day. A 3-oz serving of meat is about the same size as a deck of cards. One egg equals 1 oz. ? 2 servings of low-fat dairy each day. ? A serving of nuts, seeds, or beans 5 times each week. ? Heart-healthy fats. Healthy fats called Omega-3 fatty acids are found in foods such as flaxseeds and coldwater fish, like sardines, salmon, and mackerel.  Limit how much you eat of the following: ? Canned or prepackaged foods. ? Food that is high in trans fat, such as fried foods. ? Food that is high in saturated fat, such as fatty meat. ? Sweets, desserts, sugary drinks, and other foods with added sugar. ? Full-fat dairy products.  Do not salt foods before eating.  Try to eat at least 2 vegetarian meals each week.  Eat more home-cooked food and less restaurant, buffet, and fast food.  When eating at a restaurant, ask that your food be prepared with less salt or no salt,   if possible. What foods are recommended? The items listed may not be a complete list. Talk with your dietitian about what dietary choices are best for you. Grains Whole-grain or whole-wheat bread. Whole-grain or whole-wheat pasta. Brown rice. Oatmeal. Quinoa. Bulgur. Whole-grain and low-sodium cereals. Pita bread. Low-fat, low-sodium crackers. Whole-wheat flour tortillas. Vegetables Fresh or frozen vegetables (raw, steamed, roasted, or grilled). Low-sodium or reduced-sodium tomato and vegetable juice. Low-sodium or reduced-sodium tomato sauce and tomato paste. Low-sodium or reduced-sodium  canned vegetables. Fruits All fresh, dried, or frozen fruit. Canned fruit in natural juice (without added sugar). Meat and other protein foods Skinless chicken or turkey. Ground chicken or turkey. Pork with fat trimmed off. Fish and seafood. Egg whites. Dried beans, peas, or lentils. Unsalted nuts, nut butters, and seeds. Unsalted canned beans. Lean cuts of beef with fat trimmed off. Low-sodium, lean deli meat. Dairy Low-fat (1%) or fat-free (skim) milk. Fat-free, low-fat, or reduced-fat cheeses. Nonfat, low-sodium ricotta or cottage cheese. Low-fat or nonfat yogurt. Low-fat, low-sodium cheese. Fats and oils Soft margarine without trans fats. Vegetable oil. Low-fat, reduced-fat, or light mayonnaise and salad dressings (reduced-sodium). Canola, safflower, olive, soybean, and sunflower oils. Avocado. Seasoning and other foods Herbs. Spices. Seasoning mixes without salt. Unsalted popcorn and pretzels. Fat-free sweets. What foods are not recommended? The items listed may not be a complete list. Talk with your dietitian about what dietary choices are best for you. Grains Baked goods made with fat, such as croissants, muffins, or some breads. Dry pasta or rice meal packs. Vegetables Creamed or fried vegetables. Vegetables in a cheese sauce. Regular canned vegetables (not low-sodium or reduced-sodium). Regular canned tomato sauce and paste (not low-sodium or reduced-sodium). Regular tomato and vegetable juice (not low-sodium or reduced-sodium). Pickles. Olives. Fruits Canned fruit in a light or heavy syrup. Fried fruit. Fruit in cream or butter sauce. Meat and other protein foods Fatty cuts of meat. Ribs. Fried meat. Bacon. Sausage. Bologna and other processed lunch meats. Salami. Fatback. Hotdogs. Bratwurst. Salted nuts and seeds. Canned beans with added salt. Canned or smoked fish. Whole eggs or egg yolks. Chicken or turkey with skin. Dairy Whole or 2% milk, cream, and half-and-half. Whole or  full-fat cream cheese. Whole-fat or sweetened yogurt. Full-fat cheese. Nondairy creamers. Whipped toppings. Processed cheese and cheese spreads. Fats and oils Butter. Stick margarine. Lard. Shortening. Ghee. Bacon fat. Tropical oils, such as coconut, palm kernel, or palm oil. Seasoning and other foods Salted popcorn and pretzels. Onion salt, garlic salt, seasoned salt, table salt, and sea salt. Worcestershire sauce. Tartar sauce. Barbecue sauce. Teriyaki sauce. Soy sauce, including reduced-sodium. Steak sauce. Canned and packaged gravies. Fish sauce. Oyster sauce. Cocktail sauce. Horseradish that you find on the shelf. Ketchup. Mustard. Meat flavorings and tenderizers. Bouillon cubes. Hot sauce and Tabasco sauce. Premade or packaged marinades. Premade or packaged taco seasonings. Relishes. Regular salad dressings. Where to find more information:  National Heart, Lung, and Blood Institute: www.nhlbi.nih.gov  American Heart Association: www.heart.org Summary  The DASH eating plan is a healthy eating plan that has been shown to reduce high blood pressure (hypertension). It may also reduce your risk for type 2 diabetes, heart disease, and stroke.  With the DASH eating plan, you should limit salt (sodium) intake to 2,300 mg a day. If you have hypertension, you may need to reduce your sodium intake to 1,500 mg a day.  When on the DASH eating plan, aim to eat more fresh fruits and vegetables, whole grains, lean proteins, low-fat dairy, and heart-healthy fats.    Work with your health care provider or diet and nutrition specialist (dietitian) to adjust your eating plan to your individual calorie needs. This information is not intended to replace advice given to you by your health care provider. Make sure you discuss any questions you have with your health care provider. Document Revised: 04/07/2017 Document Reviewed: 04/18/2016 Elsevier Patient Education  2020 [employer]  

## 2020-03-05 ENCOUNTER — Other Ambulatory Visit: Payer: Self-pay | Admitting: Family Medicine

## 2020-03-05 DIAGNOSIS — M48062 Spinal stenosis, lumbar region with neurogenic claudication: Secondary | ICD-10-CM

## 2020-03-05 DIAGNOSIS — M533 Sacrococcygeal disorders, not elsewhere classified: Secondary | ICD-10-CM

## 2020-03-05 DIAGNOSIS — M47816 Spondylosis without myelopathy or radiculopathy, lumbar region: Secondary | ICD-10-CM

## 2020-03-05 MED ORDER — OXYCODONE-ACETAMINOPHEN 5-325 MG PO TABS: 1.0000 | ORAL_TABLET | ORAL | 0 refills | Status: DC | PRN

## 2020-03-05 MED ORDER — OXYCODONE-ACETAMINOPHEN 5-325 MG PO TABS: 1.0000 | ORAL_TABLET | ORAL | Status: DC | PRN

## 2020-03-05 NOTE — Telephone Encounter (Signed)
Requested medication (s) are due for refill today: yes  Requested medication (s) are on the active medication list: yes  Last refill:  02/25/2020  Future visit scheduled:no  Notes to clinic:  this refill cannot be delegated   Requested Prescriptions  Pending Prescriptions Disp Refills   oxyCODONE-acetaminophen (PERCOCET/ROXICET) 5-325 MG tablet 30 tablet     Sig: Take 1 tablet by mouth every 4 (four) hours as needed (back pain).      Not Delegated - Analgesics:  Opioid Agonist Combinations Failed - 03/05/2020 10:13 AM      Failed - This refill cannot be delegated      Failed - Urine Drug Screen completed in last 360 days.      Passed - Valid encounter within last 6 months    Recent Outpatient Visits           1 week ago Essential (primary) hypertension   [facility] Malva Limes, MD   8 months ago Essential (primary) hypertension   [facility] Malva Limes, MD   11 months ago Dysuria   [facility], Jodell Cipro, [place]   11 months ago Essential (primary) hypertension   [facility] Malva Limes, MD   1 year ago Sore throat   [facility] Maple Hudson., MD

## 2020-03-05 NOTE — Addendum Note (Signed)
Addended by: [name] E on: 03/05/2020 01:40 PM   Modules accepted: Orders

## 2020-03-05 NOTE — Addendum Note (Signed)
Addended by: Lisabeth Pick on: 03/05/2020 10:13 AM   Modules accepted: Orders

## 2020-03-05 NOTE — Telephone Encounter (Signed)
PT need a refill  oxyCODONE-acetaminophen (PERCOCET/ROXICET) 5-325 MG tablet [[id number]]  [id number]ell Halim [address]  [address] [place] [address]  Phone: [phone] Fax: [phone]

## 2020-04-05 ENCOUNTER — Other Ambulatory Visit: Payer: Self-pay | Admitting: Family Medicine

## 2020-04-05 DIAGNOSIS — I10 Essential (primary) hypertension: Secondary | ICD-10-CM

## 2020-04-05 NOTE — Telephone Encounter (Signed)
Requested Prescriptions  Pending Prescriptions Disp Refills   amLODipine (NORVASC) 5 MG tablet [Pharmacy Med Name: AMLODIPINE BESYLATE 5 MG TAB] 90 tablet 1    Sig: TAKE 1 TABLET BY MOUTH EVERY DAY IN THE EVENING     Cardiovascular:  Calcium Channel Blockers Passed - 04/05/2020  9:02 AM      Passed - Last BP in normal range    BP Readings from Last 1 Encounters:  02/25/20 134/75         Passed - Valid encounter within last 6 months    Recent Outpatient Visits          1 month ago Essential (primary) hypertension   [facility] Malva Limes, MD   9 months ago Essential (primary) hypertension   [facility], Demetrios Isaacs, MD   1 year ago Dysuria   [facility] Chrismon, Jodell Cipro, [place]   1 year ago Essential (primary) hypertension   [facility] Malva Limes, MD   1 year ago Sore throat   [facility] Maple Hudson., MD

## 2020-04-06 ENCOUNTER — Other Ambulatory Visit: Payer: Self-pay | Admitting: Family Medicine

## 2020-04-06 DIAGNOSIS — M47816 Spondylosis without myelopathy or radiculopathy, lumbar region: Secondary | ICD-10-CM

## 2020-04-06 DIAGNOSIS — M533 Sacrococcygeal disorders, not elsewhere classified: Secondary | ICD-10-CM

## 2020-04-06 DIAGNOSIS — M48062 Spinal stenosis, lumbar region with neurogenic claudication: Secondary | ICD-10-CM

## 2020-04-06 MED ORDER — OXYCODONE-ACETAMINOPHEN 5-325 MG PO TABS: 1.0000 | ORAL_TABLET | ORAL | 0 refills | Status: DC | PRN

## 2020-04-06 NOTE — Telephone Encounter (Signed)
Requested medication (s) are due for refill today: yes  Requested medication (s) are on the active medication list: yes  Last refill:  03/05/20  Future visit scheduled: no  Notes to clinic:  med not delegated to NT to RF   Requested Prescriptions  Pending Prescriptions Disp Refills   oxyCODONE-acetaminophen (PERCOCET/ROXICET) 5-325 MG tablet 160 tablet 0    Sig: Take 1 tablet by mouth every 4 (four) hours as needed (back pain).      Not Delegated - Analgesics:  Opioid Agonist Combinations Failed - 04/06/2020  9:23 AM      Failed - This refill cannot be delegated      Failed - Urine Drug Screen completed in last 360 days      Passed - Valid encounter within last 6 months    Recent Outpatient Visits           1 month ago Essential (primary) hypertension   [facility] Malva Limes, MD   9 months ago Essential (primary) hypertension   [facility], Demetrios Isaacs, MD   1 year ago Dysuria   [facility] Chrismon, Jodell Cipro, [place]   1 year ago Essential (primary) hypertension   [facility] Malva Limes, MD   1 year ago Sore throat   [facility] Maple Hudson., MD

## 2020-04-06 NOTE — Addendum Note (Signed)
Addended by: Garrison Columbus on: 04/06/2020 09:23 AM   Modules accepted: Orders

## 2020-04-06 NOTE — Telephone Encounter (Signed)
PT need a refill  oxyCODONE-acetaminophen (PERCOCET/ROXICET) 5-325 MG tablet [[id number]]  [id number] [address] Halim [address]  [address] [place] [address]  Phone: [phone] Fax: [phone]

## 2020-05-06 ENCOUNTER — Other Ambulatory Visit: Payer: Self-pay | Admitting: Family Medicine

## 2020-05-06 DIAGNOSIS — M533 Sacrococcygeal disorders, not elsewhere classified: Secondary | ICD-10-CM

## 2020-05-06 DIAGNOSIS — M47816 Spondylosis without myelopathy or radiculopathy, lumbar region: Secondary | ICD-10-CM

## 2020-05-06 DIAGNOSIS — M48062 Spinal stenosis, lumbar region with neurogenic claudication: Secondary | ICD-10-CM

## 2020-05-06 NOTE — Telephone Encounter (Unsigned)
Copied from CRM [id number]. Topic: Quick Communication - Rx Refill/Question >> May 06, 2020  2:22 PM Jaquita Rector A wrote: Medication: oxyCODONE-acetaminophen (PERCOCET/ROXICET) 5-325 MG tablet  Has the patient contacted their pharmacy? Yes.   (Agent: If no, request that the patient contact the pharmacy for the refill.) (Agent: If yes, when and what did the pharmacy advise?)  Preferred Pharmacy (with phone number or street name): [id number] [address]ell Halim [address]  Phone:  [phone] Fax:  [phone]     Agent: Please be advised that RX refills may take up to 3 business days. We ask that you follow-up with your pharmacy.

## 2020-05-06 NOTE — Telephone Encounter (Signed)
Requested medication (s) are due for refill today: yes  Requested medication (s) are on the active medication list: yes  Last refill: 04/06/2020  Future visit scheduled: yes  Notes to clinic:  this refill cannot be delegated   Requested Prescriptions  Pending Prescriptions Disp Refills   oxyCODONE-acetaminophen (PERCOCET/ROXICET) 5-325 MG tablet 160 tablet 0    Sig: Take 1 tablet by mouth every 4 (four) hours as needed (back pain).      Not Delegated - Analgesics:  Opioid Agonist Combinations Failed - 05/06/2020  2:35 PM      Failed - This refill cannot be delegated      Failed - Urine Drug Screen completed in last 360 days      Passed - Valid encounter within last 6 months    Recent Outpatient Visits           2 months ago Essential (primary) hypertension   [facility] Malva Limes, MD   10 months ago Essential (primary) hypertension   [facility], Demetrios Isaacs, MD   1 year ago Dysuria   [facility] Chrismon, Jodell Cipro, PA-C   1 year ago Essential (primary) hypertension   [facility] Malva Limes, MD   1 year ago Sore throat   [facility] Maple Hudson., MD       Future Appointments             In 1 week Fisher, Demetrios Isaacs, MD [facility], [facility]

## 2020-05-07 NOTE — Telephone Encounter (Signed)
Tried calling patient. Left detailed message on cell phone voice message system (ok per DPR).  °

## 2020-05-07 NOTE — Telephone Encounter (Signed)
Patient had labs ordered in October that are still not done, cannot approve any medication refills without labs being done

## 2020-05-11 ENCOUNTER — Other Ambulatory Visit: Payer: Self-pay

## 2020-05-11 DIAGNOSIS — Z1159 Encounter for screening for other viral diseases: Secondary | ICD-10-CM | POA: Diagnosis not present

## 2020-05-11 DIAGNOSIS — Z125 Encounter for screening for malignant neoplasm of prostate: Secondary | ICD-10-CM | POA: Diagnosis not present

## 2020-05-11 DIAGNOSIS — F1190 Opioid use, unspecified, uncomplicated: Secondary | ICD-10-CM

## 2020-05-11 DIAGNOSIS — I10 Essential (primary) hypertension: Secondary | ICD-10-CM | POA: Diagnosis not present

## 2020-05-11 NOTE — Progress Notes (Signed)
Ordered per Dr Fisher.  

## 2020-05-12 LAB — COMPREHENSIVE METABOLIC PANEL
ALT: 19 IU/L (ref 0–44)
AST: 18 IU/L (ref 0–40)
Albumin/Globulin Ratio: 1.6 (ref 1.2–2.2)
Albumin: 3.9 g/dL (ref 3.8–4.8)
Alkaline Phosphatase: 100 IU/L (ref 44–121)
BUN/Creatinine Ratio: 11 (ref 10–24)
BUN: 10 mg/dL (ref 8–27)
Bilirubin Total: 1.2 mg/dL (ref 0.0–1.2)
CO2: 23 mmol/L (ref 20–29)
Calcium: 9 mg/dL (ref 8.6–10.2)
Chloride: 102 mmol/L (ref 96–106)
Creatinine, Ser: 0.91 mg/dL (ref 0.76–1.27)
GFR calc Af Amer: 104 mL/min/{1.73_m2} (ref >59)
GFR calc non Af Amer: 90 mL/min/{1.73_m2} (ref >59)
Globulin, Total: 2.5 g/dL (ref 1.5–4.5)
Glucose: 143 mg/dL — ABNORMAL HIGH (ref 65–99)
Potassium: 3.8 mmol/L (ref 3.5–5.2)
Sodium: 140 mmol/L (ref 134–144)
Total Protein: 6.4 g/dL (ref 6.0–8.5)

## 2020-05-12 LAB — HEPATITIS C ANTIBODY: Hep C Virus Ab: <0.1 s/co ratio (ref 0.0–0.9)

## 2020-05-12 LAB — PAIN MGT SCRN (14 DRUGS), UR
Amphetamine Scrn, Ur: NEGATIVE ng/mL
BARBITURATE SCREEN URINE: NEGATIVE ng/mL
BENZODIAZEPINE SCREEN, URINE: NEGATIVE ng/mL
Buprenorphine, Urine: NEGATIVE ng/mL
CANNABINOIDS UR QL SCN: POSITIVE ng/mL — AB
Cocaine (Metab) Scrn, Ur: NEGATIVE ng/mL
Creatinine(Crt), U: 89.2 mg/dL (ref 20.0–300.0)
Fentanyl, Urine: NEGATIVE pg/mL
Meperidine Screen, Urine: NEGATIVE ng/mL
Methadone Screen, Urine: NEGATIVE ng/mL
OXYCODONE+OXYMORPHONE UR QL SCN: POSITIVE ng/mL — AB
Opiate Scrn, Ur: POSITIVE ng/mL — AB
Ph of Urine: 5.5 (ref 4.5–8.9)
Phencyclidine Qn, Ur: NEGATIVE ng/mL
Propoxyphene Scrn, Ur: NEGATIVE ng/mL
Tramadol Screen, Urine: NEGATIVE ng/mL

## 2020-05-12 LAB — PSA TOTAL (REFLEX TO FREE): Prostate Specific Ag, Serum: 2 ng/mL (ref 0.0–4.0)

## 2020-05-12 LAB — LIPID PANEL
Chol/HDL Ratio: 5.4 ratio — ABNORMAL HIGH (ref 0.0–5.0)
Cholesterol, Total: 285 mg/dL — ABNORMAL HIGH (ref 100–199)
HDL: 53 mg/dL (ref >39)
LDL Chol Calc (NIH): 198 mg/dL — ABNORMAL HIGH (ref 0–99)
Triglycerides: 180 mg/dL — ABNORMAL HIGH (ref 0–149)
VLDL Cholesterol Cal: 34 mg/dL (ref 5–40)

## 2020-05-13 ENCOUNTER — Telehealth: Payer: Self-pay

## 2020-05-13 DIAGNOSIS — E785 Hyperlipidemia, unspecified: Secondary | ICD-10-CM

## 2020-05-13 MED ORDER — ROSUVASTATIN CALCIUM 10 MG PO TABS: 10.0000 mg | ORAL_TABLET | Freq: Every day | ORAL | 2 refills | Status: DC

## 2020-05-13 NOTE — Telephone Encounter (Signed)
Patient advised and verbalized understanding. Prescription sent into pharmacy. Patient has an appointment this Friday for follow up pain medication and would like to review results again.

## 2020-05-13 NOTE — Telephone Encounter (Signed)
-----   Message from Malva Limes, MD sent at 05/12/2020  7:46 AM EST ----- Blood sugar is high at 143, it should be under 100. This is borderline for diabetes. His LDL cholesterol is also very high at 198, it should be under 130. He needs to start rosuvastatin 10mg  once every day, #30, rf x 2. Should also take with OTC coenzyme Q10. Need to strictly avoid sweets and white starchy foods in diet. Work on losing 4-5 pounds a month to help blood sugar. Need to schedule a follow up in 2 months to check on blood sugar and cholesterol.

## 2020-05-15 ENCOUNTER — Other Ambulatory Visit: Payer: Self-pay

## 2020-05-15 ENCOUNTER — Encounter: Payer: Self-pay | Admitting: Family Medicine

## 2020-05-15 ENCOUNTER — Ambulatory Visit (INDEPENDENT_AMBULATORY_CARE_PROVIDER_SITE_OTHER): Payer: Self-pay | Admitting: Family Medicine

## 2020-05-15 VITALS — BP 142/74 | HR 87 | Temp 97.9°F | Ht 67.0 in | Wt 190.0 lb

## 2020-05-15 DIAGNOSIS — I10 Essential (primary) hypertension: Secondary | ICD-10-CM

## 2020-05-15 DIAGNOSIS — R739 Hyperglycemia, unspecified: Secondary | ICD-10-CM | POA: Insufficient documentation

## 2020-05-15 DIAGNOSIS — M48062 Spinal stenosis, lumbar region with neurogenic claudication: Secondary | ICD-10-CM

## 2020-05-15 DIAGNOSIS — M47816 Spondylosis without myelopathy or radiculopathy, lumbar region: Secondary | ICD-10-CM

## 2020-05-15 DIAGNOSIS — M533 Sacrococcygeal disorders, not elsewhere classified: Secondary | ICD-10-CM

## 2020-05-15 DIAGNOSIS — E785 Hyperlipidemia, unspecified: Secondary | ICD-10-CM | POA: Insufficient documentation

## 2020-05-15 MED ORDER — OXYCODONE-ACETAMINOPHEN 5-325 MG PO TABS: 1.0000 | ORAL_TABLET | ORAL | 0 refills | Status: DC | PRN

## 2020-05-15 NOTE — Progress Notes (Signed)
Established patient visit   Patient: Alexander Mcbride   DOB: 13-Jun-1955   65 y.o. Male  MRN: [id number] Visit Date: 05/15/2020  Today's healthcare provider: [name], MD   No chief complaint on file.  Subjective    HPI   Here for follow up recent labs showing very high cholesterol and elevated blood sugar of 143. Labs were done fasting  Pertinent labs: Lab Results  Component Value Date   CHOL 285 (H) 05/11/2020   HDL 53 05/11/2020   LDLCALC 198 (H) 05/11/2020   TRIG 180 (H) 05/11/2020   CHOLHDL 5.4 (H) 05/11/2020   Lab Results  Component Value Date   NA 140 05/11/2020   K 3.8 05/11/2020   CREATININE 0.91 05/11/2020   GFRNONAA 90 05/11/2020   GFRAA 104 05/11/2020   GLUCOSE 143 (H) 05/11/2020     The 10-year ASCVD risk score Denman George DC Jr., et al., 2013) is: 16.2%   He admits to consuming quite a bit of sweets and red meats in diet. He has had a few relatives with heart disease including a brother who had a stent placed from some type of vascular disease.  ---------------------------------------------------------------------------------------------------      Medications: Outpatient Medications Prior to Visit  Medication Sig  . amLODipine (NORVASC) 5 MG tablet TAKE 1 TABLET BY MOUTH EVERY DAY IN THE EVENING  . losartan (COZAAR) 100 MG tablet TAKE 1 TABLET BY MOUTH EVERY DAY  . Melatonin 10 MG TABS Take 1 Dose by mouth at bedtime as needed. gummies  . oxyCODONE-acetaminophen (PERCOCET/ROXICET) 5-325 MG tablet Take 1 tablet by mouth every 4 (four) hours as needed (back pain).  . rosuvastatin (CRESTOR) 10 MG tablet Take 1 tablet (10 mg total) by mouth daily.   No facility-administered medications prior to visit.      Objective    BP (!) 142/74 (BP Location: Left Arm, Patient Position: Sitting, Cuff Size: Large)   Pulse 87   Temp 97.9 F (36.6 C) (Oral)   Ht 5\' 7"  (1.702 m)   Wt 190 lb (86.2 kg)   BMI 29.76 kg/m    Physical Exam  General  appearance:  Overweight male, cooperative and in no acute distress Head: Normocephalic, without obvious abnormality, atraumatic Respiratory: Respirations even and unlabored, normal respiratory rate Extremities: All extremities are intact.  Skin: Skin color, texture, turgor normal. No rashes seen  Psych: Appropriate mood and affect. Neurologic: Mental status: Alert, oriented to person, place, and time, thought content appropriate.     Assessment & Plan     1. Essential (primary) hypertension Doing well with current medications. Discussed diet and exercise recommendations for cholesterol and sugar which should also help with blood pressure. Continue current medications.    2. Hyperglycemia He admits to consuming a lot of sugar in diet which he is going to significantly reduce  3. Hyperlipidemia, unspecified hyperlipidemia type Have sent prescription for rosuvastatin 10mg  to his pharmacy which he is going to start, and is to significantly reduced saturated fats in diet.   4. Spinal stenosis, lumbar region, with neurogenic claudication Well controlled on current medication. Counseled not to mix with any non-prescribed pain medication or sedatives. He does vape THC which he should keep to a minumum.  - oxyCODONE-acetaminophen (PERCOCET/ROXICET) 5-325 MG tablet; Take 1 tablet by mouth every 4 (four) hours as needed (back pain).  Dispense: 160 tablet; Refill: 0  5. Sacroiliac joint dysfunction  - oxyCODONE-acetaminophen (PERCOCET/ROXICET) 5-325 MG tablet; Take 1 tablet by mouth  every 4 (four) hours as needed (back pain).  Dispense: 160 tablet; Refill: 0  6. Facet syndrome, lumbar  - oxyCODONE-acetaminophen (PERCOCET/ROXICET) 5-325 MG tablet; Take 1 tablet by mouth every 4 (four) hours as needed (back pain).  Dispense: 160 tablet; Refill: 0   Future Appointments  Date Time Provider Department Center  08/21/2020  3:40 PM Fisher, Demetrios Isaacs, MD [facility]            [name], MD   [facility] [phone] (phone) [phone] (fax)  [facility]

## 2020-05-15 NOTE — Telephone Encounter (Signed)
Patient has appointment scheduled today at 2:40pm.

## 2020-06-08 ENCOUNTER — Other Ambulatory Visit: Payer: Self-pay | Admitting: Family Medicine

## 2020-06-08 DIAGNOSIS — M48062 Spinal stenosis, lumbar region with neurogenic claudication: Secondary | ICD-10-CM

## 2020-06-08 DIAGNOSIS — M533 Sacrococcygeal disorders, not elsewhere classified: Secondary | ICD-10-CM

## 2020-06-08 DIAGNOSIS — M47816 Spondylosis without myelopathy or radiculopathy, lumbar region: Secondary | ICD-10-CM

## 2020-06-08 NOTE — Telephone Encounter (Signed)
Medication Refill - Medication: oxyCODONE-acetaminophen (PERCOCET/ROXICET) 5-325 MG tablet    Has the patient contacted their pharmacy? No. (Agent: If no, request that the patient contact the pharmacy for the refill.) (Agent: If yes, when and what did the pharmacy advise?)  Preferred Pharmacy (with phone number or street name):  [id number] [address]ell Halim [address] Phone:  [phone]  Fax:  [phone]       Agent: Please be advised that RX refills may take up to 3 business days. We ask that you follow-up with your pharmacy.

## 2020-06-08 NOTE — Telephone Encounter (Signed)
Requested medication (s) are due for refill today - no  Requested medication (s) are on the active medication list -yes  Future visit scheduled -yes  Last refill: 05/15/20  Notes to clinic: request RF non delegated Rx  Requested Prescriptions  Pending Prescriptions Disp Refills   oxyCODONE-acetaminophen (PERCOCET/ROXICET) 5-325 MG tablet 160 tablet 0    Sig: Take 1 tablet by mouth every 4 (four) hours as needed (back pain).      Not Delegated - Analgesics:  Opioid Agonist Combinations Failed - 06/08/2020  3:24 PM      Failed - This refill cannot be delegated      Failed - Urine Drug Screen completed in last 360 days      Passed - Valid encounter within last 6 months    Recent Outpatient Visits           3 weeks ago Essential (primary) hypertension   [facility] Malva Limes, MD   3 months ago Essential (primary) hypertension   [facility], Demetrios Isaacs, MD   11 months ago Essential (primary) hypertension   [facility], Demetrios Isaacs, MD   1 year ago Dysuria   [facility] Chrismon, Jodell Cipro, PA-C   1 year ago Essential (primary) hypertension   [facility], MD       Future Appointments             In 2 months Fisher, Demetrios Isaacs, MD [facility], [facility]                 Requested Prescriptions  Pending Prescriptions Disp Refills   oxyCODONE-acetaminophen (PERCOCET/ROXICET) 5-325 MG tablet 160 tablet 0    Sig: Take 1 tablet by mouth every 4 (four) hours as needed (back pain).      Not Delegated - Analgesics:  Opioid Agonist Combinations Failed - 06/08/2020  3:24 PM      Failed - This refill cannot be delegated      Failed - Urine Drug Screen completed in last 360 days      Passed - Valid encounter within last 6 months    Recent Outpatient Visits           3 weeks ago Essential (primary) hypertension   [facility] Malva Limes, MD    3 months ago Essential (primary) hypertension   [facility] Malva Limes, MD   11 months ago Essential (primary) hypertension   [facility], Demetrios Isaacs, MD   1 year ago Dysuria   [facility] Chrismon, Jodell Cipro, PA-C   1 year ago Essential (primary) hypertension   [facility], Demetrios Isaacs, MD       Future Appointments             In 2 months Fisher, Demetrios Isaacs, MD [facility], [facility]

## 2020-06-09 ENCOUNTER — Other Ambulatory Visit: Payer: Self-pay | Admitting: Family Medicine

## 2020-06-09 DIAGNOSIS — I10 Essential (primary) hypertension: Secondary | ICD-10-CM

## 2020-06-09 MED ORDER — OXYCODONE-ACETAMINOPHEN 5-325 MG PO TABS: 1.0000 | ORAL_TABLET | ORAL | 0 refills | Status: DC | PRN

## 2020-06-18 ENCOUNTER — Other Ambulatory Visit: Payer: Self-pay | Admitting: Family Medicine

## 2020-06-18 DIAGNOSIS — I10 Essential (primary) hypertension: Secondary | ICD-10-CM

## 2020-07-08 ENCOUNTER — Other Ambulatory Visit: Payer: Self-pay | Admitting: Family Medicine

## 2020-07-08 DIAGNOSIS — M533 Sacrococcygeal disorders, not elsewhere classified: Secondary | ICD-10-CM

## 2020-07-08 DIAGNOSIS — M48062 Spinal stenosis, lumbar region with neurogenic claudication: Secondary | ICD-10-CM

## 2020-07-08 DIAGNOSIS — M47816 Spondylosis without myelopathy or radiculopathy, lumbar region: Secondary | ICD-10-CM

## 2020-07-08 MED ORDER — OXYCODONE-ACETAMINOPHEN 5-325 MG PO TABS: 1.0000 | ORAL_TABLET | ORAL | 0 refills | Status: DC | PRN

## 2020-07-08 NOTE — Telephone Encounter (Signed)
Medication Refill - Medication: oxyCODONE-acetaminophen (PERCOCET/ROXICET) 5-325 MG tablet    Has the patient contacted their pharmacy? No. (Agent: If no, request that the patient contact the pharmacy for the refill.) (Agent: If yes, when and what did the pharmacy advise?)  Preferred Pharmacy (with phone number or street name):  [phone]ell Halim [address]  [address] [place] [address]  Phone: [phone] Fax: [phone]     Agent: Please be advised that RX refills may take up to 3 business days. We ask that you follow-up with your pharmacy.

## 2020-07-08 NOTE — Telephone Encounter (Signed)
Requested medication (s) are due for refill today:yes  Requested medication (s) are on the active medication list: yes  Last refill:  06/09/2020  Future visit scheduled: yes  Notes to clinic:  this refill cannot be delegated    Requested Prescriptions  Pending Prescriptions Disp Refills   oxyCODONE-acetaminophen (PERCOCET/ROXICET) 5-325 MG tablet 160 tablet 0    Sig: Take 1 tablet by mouth every 4 (four) hours as needed (back pain).      There is no refill protocol information for this order

## 2020-08-07 ENCOUNTER — Other Ambulatory Visit: Payer: Self-pay | Admitting: Family Medicine

## 2020-08-07 DIAGNOSIS — E785 Hyperlipidemia, unspecified: Secondary | ICD-10-CM

## 2020-08-12 ENCOUNTER — Other Ambulatory Visit: Payer: Self-pay | Admitting: Family Medicine

## 2020-08-12 DIAGNOSIS — M47816 Spondylosis without myelopathy or radiculopathy, lumbar region: Secondary | ICD-10-CM

## 2020-08-12 DIAGNOSIS — M533 Sacrococcygeal disorders, not elsewhere classified: Secondary | ICD-10-CM

## 2020-08-12 DIAGNOSIS — M48062 Spinal stenosis, lumbar region with neurogenic claudication: Secondary | ICD-10-CM

## 2020-08-12 MED ORDER — OXYCODONE-ACETAMINOPHEN 5-325 MG PO TABS: 1.0000 | ORAL_TABLET | ORAL | 0 refills | Status: DC | PRN

## 2020-08-12 NOTE — Telephone Encounter (Signed)
Requested medication (s) are due for refill today: yes   Requested medication (s) are on the active medication list: yes  Last refill: 07/08/2020  Future visit scheduled: yes  Notes to clinic:  this refill cannot be delegated    Requested Prescriptions  Pending Prescriptions Disp Refills   oxyCODONE-acetaminophen (PERCOCET/ROXICET) 5-325 MG tablet 160 tablet 0    Sig: Take 1 tablet by mouth every 4 (four) hours as needed (back pain).      There is no refill protocol information for this order

## 2020-08-12 NOTE — Telephone Encounter (Signed)
Copied from CRM [id number]. Topic: Quick Communication - Rx Refill/Question >> Aug 12, 2020  9:04 AM [name] wrote: Medication: oxyCODONE-acetaminophen (PERCOCET/ROXICET) 5-325 MG tablet  Has the patient contacted their pharmacy? no - Pt states pain medication and he was instructed to contact provider   Preferred Pharmacy (with phone number or street name): [id number] [address]ell Halim [address]  Phone: [phone]   Fax: [phone]  Agent: Please be advised that RX refills may take up to 3 business days. We ask that you follow-up with your pharmacy.

## 2020-08-21 ENCOUNTER — Ambulatory Visit: Payer: Self-pay | Admitting: Family Medicine

## 2020-08-21 NOTE — Progress Notes (Deleted)
Established patient visit   Patient: Alexander Mcbride   DOB: Dec 04, 1955   65 y.o. Male  MRN: [id number] Visit Date: 08/21/2020  Today's healthcare provider: [name], MD   No chief complaint on file.  Subjective    HPI  Hypertension, follow-up  BP Readings from Last 3 Encounters:  05/15/20 (!) 142/74  02/25/20 134/75  06/18/19 140/64   Wt Readings from Last 3 Encounters:  05/15/20 190 lb (86.2 kg)  02/25/20 195 lb (88.5 kg)  06/18/19 205 lb (93 kg)     He was last seen for hypertension 3 months ago.  BP at that visit was 142/74. Management since that visit includes no changes.  He reports {excellent/good/fair/poor:19665} compliance with treatment. He {is/is not:9024} having side effects. {document side effects if present:1} He is following a {diet:21022986} diet. He {is/is not:9024} exercising. He {does/does not:200015} smoke.  Use of agents associated with hypertension: none.   Outside blood pressures are {***enter patient reported home BP readings, or 'not being checked':1}. Symptoms: {Yes/No:20286} chest pain {Yes/No:20286} chest pressure  {Yes/No:20286} palpitations {Yes/No:20286} syncope  {Yes/No:20286} dyspnea {Yes/No:20286} orthopnea  {Yes/No:20286} paroxysmal nocturnal dyspnea {Yes/No:20286} lower extremity edema   Pertinent labs: Lab Results  Component Value Date   CHOL 285 (H) 05/11/2020   HDL 53 05/11/2020   LDLCALC 198 (H) 05/11/2020   TRIG 180 (H) 05/11/2020   CHOLHDL 5.4 (H) 05/11/2020   Lab Results  Component Value Date   NA 140 05/11/2020   K 3.8 05/11/2020   CREATININE 0.91 05/11/2020   GFRNONAA 90 05/11/2020   GFRAA 104 05/11/2020   GLUCOSE 143 (H) 05/11/2020     The 10-year ASCVD risk score Denman George DC Jr., et al., 2013) is: 19%   --------------------------------------------------------------------------------------------------- Lipid/Cholesterol, Follow-up  Last lipid panel Other pertinent labs  Lab Results   Component Value Date   CHOL 285 (H) 05/11/2020   HDL 53 05/11/2020   LDLCALC 198 (H) 05/11/2020   TRIG 180 (H) 05/11/2020   CHOLHDL 5.4 (H) 05/11/2020   Lab Results  Component Value Date   ALT 19 05/11/2020   AST 18 05/11/2020   PLT 283 10/17/2013     He was last seen for this 3  months ago.  Management since that visit includes check labs. Cholesterol levels were high and patient was started on rosuvastatin 10mg  once every day. Should also take with OTC coenzyme Q10.  Patient was advised to significantly reduce saturated fats in diet.  He reports {excellent/good/fair/poor:19665} compliance with treatment. He {is/is not:9024} having side effects. {document side effects if present:1}  Symptoms: {Yes/No:20286} chest pain {Yes/No:20286} chest pressure/discomfort  {Yes/No:20286} dyspnea {Yes/No:20286} lower extremity edema  {Yes/No:20286} numbness or tingling of extremity {Yes/No:20286} orthopnea  {Yes/No:20286} palpitations {Yes/No:20286} paroxysmal nocturnal dyspnea  {Yes/No:20286} speech difficulty {Yes/No:20286} syncope   Current diet: {diet habits:16563} Current exercise: {exercise types:16438}  The 10-year ASCVD risk score DC Jr., et al., 2013) is: 19%  --------------------------------------------------------------------------------------------------- Prediabetes, Follow-up  Lab Results  Component Value Date   HGBA1C 5.5 10/09/2017   GLUCOSE 143 (H) 05/11/2020   GLUCOSE 111 (H) 10/29/2018   GLUCOSE 110 (H) 04/27/2016    Last seen for for this 3 months ago.  Management since that visit includes check lab. Labs were completed on 05/11/2020 showing high blood sugar levels. Patient was advised to strictly avoid sweets and white starchy foods in diet. Work on losing 4-5 pounds a month to help blood sugar.  Current symptoms include {Symptoms; diabetes:14075} and have been {Desc; course:15616}.  Prior visit with dietician: no Current diet: {diet  habits:16563} Current exercise: {exercise types:16438}  Pertinent Labs:    Component Value Date/Time   CHOL 285 (H) 05/11/2020 0854   TRIG 180 (H) 05/11/2020 0854   CHOLHDL 5.4 (H) 05/11/2020 0854   CREATININE 0.91 05/11/2020 0854    Wt Readings from Last 3 Encounters:  05/15/20 190 lb (86.2 kg)  02/25/20 195 lb (88.5 kg)  06/18/19 205 lb (93 kg)    -----------------------------------------------------------------------------------------  Patient Active Problem List   Diagnosis Date Noted  . Hyperlipidemia 05/15/2020  . Hyperglycemia 05/15/2020  . Chronic, continuous use of opioids 02/25/2020  . Chronic foot pain, left 08/23/2016  . DDD (degenerative disc disease), lumbar 02/19/2015  . DDD (degenerative disc disease), thoracic 02/19/2015  . Facet syndrome, lumbar 02/19/2015  . Spinal stenosis, lumbar region, with neurogenic claudication 02/19/2015  . Sacroiliac joint dysfunction 02/19/2015  . Personal history of urinary calculi 10/10/2014  . Chronic low back pain 10/10/2014  . Back pain, thoracic 10/10/2014  . Combined fat and carbohydrate induced hyperlipemia 06/01/2009  . Essential (primary) hypertension 05/06/2009   Social History   Tobacco Use  . Smoking status: Former Games developer  . Smokeless tobacco: Never Used  Substance Use Topics  . Alcohol use: Yes    Alcohol/week: 0.0 standard drinks    Comment: occasionally  . Drug use: No   Allergies  Allergen Reactions  . Gabapentin Other (See Comments)    body aches  . Lisinopril Rash       Medications: Outpatient Medications Prior to Visit  Medication Sig  . amLODipine (NORVASC) 5 MG tablet TAKE 1 TABLET BY MOUTH EVERY DAY IN THE EVENING  . co-enzyme Q-10 50 MG capsule Take 50 mg by mouth daily.  Marland Kitchen losartan (COZAAR) 100 MG tablet TAKE 1 TABLET BY MOUTH EVERY DAY  . Melatonin 10 MG TABS Take 1 Dose by mouth at bedtime as needed. gummies  . oxyCODONE-acetaminophen (PERCOCET/ROXICET) 5-325 MG tablet Take 1 tablet  by mouth every 4 (four) hours as needed (back pain).  . rosuvastatin (CRESTOR) 10 MG tablet TAKE 1 TABLET BY MOUTH EVERY DAY   No facility-administered medications prior to visit.    Review of Systems  {Labs  Heme  Chem  Endocrine  Serology  Results Review (optional):[id number]::" "}    Objective    There were no vitals taken for this visit. BP Readings from Last 3 Encounters:  05/15/20 (!) 142/74  02/25/20 134/75  06/18/19 140/64   Wt Readings from Last 3 Encounters:  05/15/20 190 lb (86.2 kg)  02/25/20 195 lb (88.5 kg)  06/18/19 205 lb (93 kg)      Physical Exam  ***  No results found for any visits on 08/21/20.  Assessment & Plan     ***  No follow-ups on file.      {provider attestation***:1}   [name], MD  [facility] [phone] (phone) [phone] (fax)  [facility]

## 2020-08-21 NOTE — Patient Instructions (Incomplete)

## 2020-09-06 ENCOUNTER — Other Ambulatory Visit: Payer: Self-pay | Admitting: Family Medicine

## 2020-09-06 DIAGNOSIS — I10 Essential (primary) hypertension: Secondary | ICD-10-CM

## 2020-09-11 ENCOUNTER — Other Ambulatory Visit: Payer: Self-pay | Admitting: Family Medicine

## 2020-09-11 DIAGNOSIS — M47816 Spondylosis without myelopathy or radiculopathy, lumbar region: Secondary | ICD-10-CM

## 2020-09-11 DIAGNOSIS — M533 Sacrococcygeal disorders, not elsewhere classified: Secondary | ICD-10-CM

## 2020-09-11 DIAGNOSIS — M48062 Spinal stenosis, lumbar region with neurogenic claudication: Secondary | ICD-10-CM

## 2020-09-11 MED ORDER — OXYCODONE-ACETAMINOPHEN 5-325 MG PO TABS: 1.0000 | ORAL_TABLET | ORAL | 0 refills | Status: DC | PRN

## 2020-09-11 NOTE — Telephone Encounter (Signed)
Requested medication (s) are due for refill today: yes  Requested medication (s) are on the active medication list: yes  Last refill: 08/12/2020  Future visit scheduled: yes  Notes to clinic:  this refill cannot be delegated    Requested Prescriptions  Pending Prescriptions Disp Refills   oxyCODONE-acetaminophen (PERCOCET/ROXICET) 5-325 MG tablet 160 tablet 0    Sig: Take 1 tablet by mouth every 4 (four) hours as needed (back pain).      There is no refill protocol information for this order

## 2020-09-11 NOTE — Telephone Encounter (Signed)
Medication Refill - Medication: oxyCODONE-acetaminophen (PERCOCET/ROXICET) 5-325 MG tablet     Preferred Pharmacy (with phone number or street name):  [id number] Hassell Halim [address] Phone:  [phone]  Fax:  [phone]       Agent: Please be advised that RX refills may take up to 3 business days. We ask that you follow-up with your pharmacy.

## 2020-09-18 ENCOUNTER — Ambulatory Visit (INDEPENDENT_AMBULATORY_CARE_PROVIDER_SITE_OTHER): Payer: Self-pay | Admitting: Family Medicine

## 2020-09-18 ENCOUNTER — Other Ambulatory Visit: Payer: Self-pay

## 2020-09-18 VITALS — BP 144/79 | HR 74 | Ht 68.0 in | Wt 192.0 lb

## 2020-09-18 DIAGNOSIS — E785 Hyperlipidemia, unspecified: Secondary | ICD-10-CM

## 2020-09-18 DIAGNOSIS — R739 Hyperglycemia, unspecified: Secondary | ICD-10-CM | POA: Diagnosis not present

## 2020-09-18 DIAGNOSIS — M546 Pain in thoracic spine: Secondary | ICD-10-CM

## 2020-09-18 DIAGNOSIS — G8929 Other chronic pain: Secondary | ICD-10-CM

## 2020-09-18 NOTE — Progress Notes (Signed)
Established patient visit   Patient: Alexander Mcbride   DOB: 03/02/1956   65 y.o. Male  MRN: [id number] Visit Date: 09/18/2020  Today's healthcare provider: [name], MD   Chief Complaint  Patient presents with  . Hypertension  . Hyperlipidemia   Subjective    HPI  Patient presents with back pain. Patient injured back years ago. Patient is having pain in lower back and now says it came back after working out a few months ago. Back is aggravated by twisting motion.Patient denies issues using restroom and radiating pain. Patient is using heating pads and icy hot without relief.  Hypertension, follow-up  BP Readings from Last 3 Encounters:  09/18/20 (!) 144/79  05/15/20 (!) 142/74  02/25/20 134/75   Wt Readings from Last 3 Encounters:  09/18/20 192 lb (87.1 kg)  05/15/20 190 lb (86.2 kg)  02/25/20 195 lb (88.5 kg)     He was last seen for hypertension 4 months ago.  BP at that visit was 142/74. Management since that visit includes none.  He reports decent compliance with treatment. He is not having side effects.  He is following a Regular diet. He is exercising. He does not smoke.  Use of agents associated with hypertension: none.    Outside blood pressures are n/a. Symptoms: No chest pain No chest pressure  No palpitations No syncope  Yes dyspnea No orthopnea  No paroxysmal nocturnal dyspnea No lower extremity edema   Pertinent labs: Lab Results  Component Value Date   CHOL 285 (H) 05/11/2020   HDL 53 05/11/2020   LDLCALC 198 (H) 05/11/2020   TRIG 180 (H) 05/11/2020   CHOLHDL 5.4 (H) 05/11/2020   Lab Results  Component Value Date   NA 140 05/11/2020   K 3.8 05/11/2020   CREATININE 0.91 05/11/2020   GFRNONAA 90 05/11/2020   GFRAA 104 05/11/2020   GLUCOSE 143 (H) 05/11/2020     The 10-year ASCVD risk score Denman George DC Jr., et al., 2013) is: 19.4%    --------------------------------------------------------------------------------------------------- Lipid/Cholesterol, Follow-up  Last lipid panel Other pertinent labs  Lab Results  Component Value Date   CHOL 285 (H) 05/11/2020   HDL 53 05/11/2020   LDLCALC 198 (H) 05/11/2020   TRIG 180 (H) 05/11/2020   CHOLHDL 5.4 (H) 05/11/2020   Lab Results  Component Value Date   ALT 19 05/11/2020   AST 18 05/11/2020   PLT 283 10/17/2013     He was last seen for this 4 months ago.  Management since that visit includes rosuvastatin 10mg  to his pharmacy which he is going to start.  He reports excellent compliance with treatment. He is not having side effects.   Symptoms: No chest pain No chest pressure/discomfort  No dyspnea No lower extremity edema  No numbness or tingling of extremity No orthopnea  No palpitations No paroxysmal nocturnal dyspnea  No speech difficulty No syncope   Current diet: well balanced Current exercise:resistance bands    The 10-year ASCVD risk score DC Jr., et al., 2013) is: 19.4%  --------------------------------------------------------------------------------------------------- He also complains of persistently worsening pain middle third of back right of midline to flank.       Medications: Outpatient Medications Prior to Visit  Medication Sig  . amLODipine (NORVASC) 5 MG tablet TAKE 1 TABLET BY MOUTH EVERY DAY IN THE EVENING  . co-enzyme Q-10 50 MG capsule Take 50 mg by mouth daily.  2014 losartan (COZAAR) 100 MG tablet Take 1 tablet (100 mg  total) by mouth daily.  Marland Kitchen oxyCODONE-acetaminophen (PERCOCET/ROXICET) 5-325 MG tablet Take 1 tablet by mouth every 4 (four) hours as needed (back pain).  . rosuvastatin (CRESTOR) 10 MG tablet TAKE 1 TABLET BY MOUTH EVERY DAY  . Melatonin 10 MG TABS Take 1 Dose by mouth at bedtime as needed. gummies (Patient not taking: Reported on 09/18/2020)   No facility-administered medications prior to visit.     Review of Systems  Musculoskeletal: Positive for back pain and myalgias.       Objective    BP (!) 144/79 (BP Location: Left Arm, Patient Position: Sitting, Cuff Size: Large)   Pulse 74   Ht 5\' 8"  (1.727 m)   Wt 192 lb (87.1 kg)   SpO2 98%   BMI 29.19 kg/m     Physical Exam    General: Appearance:     Overweight male in no acute distress  Eyes:    PERRL, conjunctiva/corneas clear, EOM's intact       Lungs:     Clear to auscultation bilaterally, respirations unlabored  Heart:    Normal heart rate. Normal rhythm. No murmurs, rubs, or gallops.   MS:   All extremities are intact.   Neurologic:   Awake, alert, oriented x 3. No apparent focal neurological           defect.        Assessment & Plan     1. Hyperlipidemia, unspecified hyperlipidemia type He is tolerating initiation of rosuvastatin well with no adverse effects.   - Comprehensive metabolic panel - Lipid panel - Direct LDL  2. Hyperglycemia He has cut back quit a bit on sugary drinks, but admits to still eating more sweets than he should  - Hemoglobin A1c  3. Chronic right-sided thoracic back pain  - CT THORACIC SPINE WO CONTRAST; Future            , MD  [facility] [phone] (phone) [phone] (fax)  [facility]

## 2020-09-18 NOTE — Patient Instructions (Signed)
.   Please review the attached list of medications and notify my office if there are any errors.   . Please bring all of your medications to every appointment so we can make sure that our medication list is the same as yours.   

## 2020-09-19 LAB — COMPREHENSIVE METABOLIC PANEL
ALT: 27 IU/L (ref 0–44)
AST: 29 IU/L (ref 0–40)
Albumin/Globulin Ratio: 1.7 (ref 1.2–2.2)
Albumin: 3.4 g/dL — ABNORMAL LOW (ref 3.8–4.8)
Alkaline Phosphatase: 93 IU/L (ref 44–121)
BUN/Creatinine Ratio: 14 (ref 10–24)
BUN: 18 mg/dL (ref 8–27)
Bilirubin Total: 0.6 mg/dL (ref 0.0–1.2)
CO2: 25 mmol/L (ref 20–29)
Calcium: 8.3 mg/dL — ABNORMAL LOW (ref 8.6–10.2)
Chloride: 105 mmol/L (ref 96–106)
Creatinine, Ser: 1.29 mg/dL — ABNORMAL HIGH (ref 0.76–1.27)
Globulin, Total: 2 g/dL (ref 1.5–4.5)
Glucose: 92 mg/dL (ref 65–99)
Potassium: 3.9 mmol/L (ref 3.5–5.2)
Sodium: 142 mmol/L (ref 134–144)
Total Protein: 5.4 g/dL — ABNORMAL LOW (ref 6.0–8.5)
eGFR: 62 mL/min/{1.73_m2} (ref >59)

## 2020-09-19 LAB — HEMOGLOBIN A1C
Est. average glucose Bld gHb Est-mCnc: 114 mg/dL
Hgb A1c MFr Bld: 5.6 % (ref 4.8–5.6)

## 2020-09-19 LAB — LIPID PANEL
Chol/HDL Ratio: 3.6 ratio (ref 0.0–5.0)
Cholesterol, Total: 199 mg/dL (ref 100–199)
HDL: 55 mg/dL (ref >39)
LDL Chol Calc (NIH): 99 mg/dL (ref 0–99)
Triglycerides: 269 mg/dL — ABNORMAL HIGH (ref 0–149)
VLDL Cholesterol Cal: 45 mg/dL — ABNORMAL HIGH (ref 5–40)

## 2020-09-19 LAB — LDL CHOLESTEROL, DIRECT: LDL Direct: 97 mg/dL (ref 0–99)

## 2020-10-06 ENCOUNTER — Ambulatory Visit
Admission: RE | Admit: 2020-10-06 | Discharge: 2020-10-06 | Disposition: A | Discharge status: Home / Self Care | Payer: Self-pay | Source: Ambulatory Visit | Attending: Family Medicine | Admitting: Family Medicine

## 2020-10-06 ENCOUNTER — Other Ambulatory Visit: Payer: Self-pay

## 2020-10-06 DIAGNOSIS — M546 Pain in thoracic spine: Secondary | ICD-10-CM | POA: Insufficient documentation

## 2020-10-06 DIAGNOSIS — G8929 Other chronic pain: Secondary | ICD-10-CM | POA: Insufficient documentation

## 2020-10-09 ENCOUNTER — Other Ambulatory Visit: Payer: Self-pay | Admitting: Family Medicine

## 2020-10-09 ENCOUNTER — Telehealth: Payer: Self-pay

## 2020-10-09 DIAGNOSIS — M5442 Lumbago with sciatica, left side: Secondary | ICD-10-CM

## 2020-10-09 DIAGNOSIS — I10 Essential (primary) hypertension: Secondary | ICD-10-CM

## 2020-10-09 DIAGNOSIS — M48062 Spinal stenosis, lumbar region with neurogenic claudication: Secondary | ICD-10-CM

## 2020-10-09 DIAGNOSIS — G8929 Other chronic pain: Secondary | ICD-10-CM

## 2020-10-09 NOTE — Telephone Encounter (Signed)
Pt advised.  He would like to proceed with physical therapy.   Thanks,   -Vernona Rieger

## 2020-10-09 NOTE — Telephone Encounter (Signed)
-----   Message from Malva Limes, MD sent at 10/09/2020  2:47 PM EDT ----- Ct of back shows some arthritis of of spine but is otherwise normal. If not improving recommend referral to physical therapy.

## 2020-10-12 ENCOUNTER — Telehealth: Payer: Self-pay | Admitting: Family Medicine

## 2020-10-12 DIAGNOSIS — M47816 Spondylosis without myelopathy or radiculopathy, lumbar region: Secondary | ICD-10-CM

## 2020-10-12 DIAGNOSIS — M48062 Spinal stenosis, lumbar region with neurogenic claudication: Secondary | ICD-10-CM

## 2020-10-12 DIAGNOSIS — M533 Sacrococcygeal disorders, not elsewhere classified: Secondary | ICD-10-CM

## 2020-10-12 NOTE — Telephone Encounter (Signed)
Copied from CRM [id number]. Topic: Quick Communication - Rx Refill/Question >> Oct 12, 2020  3:05 PM Jaquita Rector A wrote: Medication: oxyCODONE-acetaminophen (PERCOCET/ROXICET) 5-325 MG tablet   Has the patient contacted their pharmacy? Yes.   (Agent: If no, request that the patient contact the pharmacy for the refill.) (Agent: If yes, when and what did the pharmacy advise?)  Preferred Pharmacy (with phone number or street name): [id number] [address]ell Halim [address]  Phone:  [phone] Fax:  [phone]     Agent: Please be advised that RX refills may take up to 3 business days. We ask that you follow-up with your pharmacy.

## 2020-10-12 NOTE — Telephone Encounter (Signed)
Requested medication (s) are due for refill today: yes  Requested medication (s) are on the active medication list: yes  Last refill:  09/11/2020  Future visit scheduled: yes   Notes to clinic:  this refill cannot be delegated   Requested Prescriptions  Pending Prescriptions Disp Refills   oxyCODONE-acetaminophen (PERCOCET/ROXICET) 5-325 MG tablet 160 tablet 0    Sig: Take 1 tablet by mouth every 4 (four) hours as needed (back pain).      There is no refill protocol information for this order

## 2020-10-13 MED ORDER — OXYCODONE-ACETAMINOPHEN 5-325 MG PO TABS: 1.0000 | ORAL_TABLET | ORAL | 0 refills | Status: DC | PRN

## 2020-11-07 ENCOUNTER — Other Ambulatory Visit: Payer: Self-pay | Admitting: Family Medicine

## 2020-11-07 DIAGNOSIS — E785 Hyperlipidemia, unspecified: Secondary | ICD-10-CM

## 2020-11-07 NOTE — Telephone Encounter (Signed)
Requested Prescriptions  Pending Prescriptions Disp Refills  . rosuvastatin (CRESTOR) 10 MG tablet [Pharmacy Med Name: ROSUVASTATIN CALCIUM 10 MG TAB] 90 tablet 2    Sig: TAKE 1 TABLET BY MOUTH EVERY DAY     Cardiovascular:  Antilipid - Statins Failed - 11/07/2020 12:08 PM      Failed - Triglycerides in normal range and within 360 days    Triglycerides  Date Value Ref Range Status  09/18/2020 269 (H) 0 - 149 mg/dL Final         Passed - Total Cholesterol in normal range and within 360 days    Cholesterol, Total  Date Value Ref Range Status  09/18/2020 199 100 - 199 mg/dL Final         Passed - LDL in normal range and within 360 days    LDL Chol Calc (NIH)  Date Value Ref Range Status  09/18/2020 99 0 - 99 mg/dL Final   LDL Direct  Date Value Ref Range Status  09/18/2020 97 0 - 99 mg/dL Final         Passed - HDL in normal range and within 360 days    HDL  Date Value Ref Range Status  09/18/2020 55 >39 mg/dL Final         Passed - Patient is not pregnant      Passed - Valid encounter within last 12 months    Recent Outpatient Visits          1 month ago Hyperlipidemia, unspecified hyperlipidemia type   [facility] Malva Limes, MD   5 months ago Essential (primary) hypertension   [facility] Fisher, Demetrios Isaacs, MD   8 months ago Essential (primary) hypertension   [facility], Demetrios Isaacs, MD   1 year ago Essential (primary) hypertension   [facility], Demetrios Isaacs, MD   1 year ago Dysuria   [facility] Chrismon, Jodell Cipro, PA-C      Future Appointments            In 1 month Fisher, Demetrios Isaacs, MD [facility]

## 2020-11-13 ENCOUNTER — Other Ambulatory Visit: Payer: Self-pay | Admitting: Family Medicine

## 2020-11-13 DIAGNOSIS — M47816 Spondylosis without myelopathy or radiculopathy, lumbar region: Secondary | ICD-10-CM

## 2020-11-13 DIAGNOSIS — M48062 Spinal stenosis, lumbar region with neurogenic claudication: Secondary | ICD-10-CM

## 2020-11-13 DIAGNOSIS — M533 Sacrococcygeal disorders, not elsewhere classified: Secondary | ICD-10-CM

## 2020-11-13 MED ORDER — OXYCODONE-ACETAMINOPHEN 5-325 MG PO TABS: 1.0000 | ORAL_TABLET | ORAL | 0 refills | Status: DC | PRN

## 2020-11-13 NOTE — Telephone Encounter (Signed)
Medication Refill - Medication: oxyCODONE-acetaminophen (PERCOCET/ROXICET) 5-325 MG table  Has the patient contacted their pharmacy? No.   Preferred Pharmacy (with phone number or street name): [phone] Nicholes Rough [facility] [address]  [address] [place] [address]  Phone:  [phone]  Fax:  [phone]   Agent: Please be advised that RX refills may take up to 3 business days. We ask that you follow-up with your pharmacy.

## 2020-11-13 NOTE — Telephone Encounter (Signed)
Requested medication (s) are due for refill today- yes  Requested medication (s) are on the active medication list -yes  Future visit scheduled -yes  Last refill: 10/12/20  Notes to clinic: Request RF- non delegated Rx  Requested Prescriptions  Pending Prescriptions Disp Refills   oxyCODONE-acetaminophen (PERCOCET/ROXICET) 5-325 MG tablet 160 tablet 0    Sig: Take 1 tablet by mouth every 4 (four) hours as needed (back pain).      Not Delegated - Analgesics:  Opioid Agonist Combinations Failed - 11/13/2020 12:43 PM      Failed - This refill cannot be delegated      Failed - Urine Drug Screen completed in last 360 days      Passed - Valid encounter within last 6 months    Recent Outpatient Visits           1 month ago Hyperlipidemia, unspecified hyperlipidemia type   [facility] Malva Limes, MD   6 months ago Essential (primary) hypertension   [facility] Malva Limes, MD   8 months ago Essential (primary) hypertension   [facility] Malva Limes, MD   1 year ago Essential (primary) hypertension   [facility] Malva Limes, MD   1 year ago Dysuria   [facility] Chrismon, Jodell Cipro, PA-C       Future Appointments             In 4 weeks Fisher, Demetrios Isaacs, MD [facility]                 Requested Prescriptions  Pending Prescriptions Disp Refills   oxyCODONE-acetaminophen (PERCOCET/ROXICET) 5-325 MG tablet 160 tablet 0    Sig: Take 1 tablet by mouth every 4 (four) hours as needed (back pain).      Not Delegated - Analgesics:  Opioid Agonist Combinations Failed - 11/13/2020 12:43 PM      Failed - This refill cannot be delegated      Failed - Urine Drug Screen completed in last 360 days      Passed - Valid encounter within last 6 months    Recent Outpatient Visits           1 month ago Hyperlipidemia, unspecified hyperlipidemia type   [facility] Malva Limes, MD   6 months ago Essential (primary) hypertension   [facility] Malva Limes, MD   8 months ago Essential (primary) hypertension   [facility] Malva Limes, MD   1 year ago Essential (primary) hypertension   [facility] Malva Limes, MD   1 year ago Dysuria   [facility] Chrismon, Jodell Cipro, PA-C       Future Appointments             In 4 weeks Fisher, Demetrios Isaacs, MD [facility], [facility]

## 2020-12-11 ENCOUNTER — Ambulatory Visit: Payer: Self-pay | Admitting: Family Medicine

## 2020-12-11 NOTE — Progress Notes (Deleted)
Established patient visit   Patient: Alexander Mcbride   DOB: Jan 09, 1956   65 y.o. Male  MRN: [id number] Visit Date: 12/11/2020  Today's healthcare provider: [name], MD   No chief complaint on file.  Subjective    HPI  Hypertension, follow-up  BP Readings from Last 3 Encounters:  09/18/20 (!) 144/79  05/15/20 (!) 142/74  02/25/20 134/75   Wt Readings from Last 3 Encounters:  09/18/20 192 lb (87.1 kg)  05/15/20 190 lb (86.2 kg)  02/25/20 195 lb (88.5 kg)     He was last seen for hypertension 3 months ago.  BP at that visit was 144/79. Management since that visit includes; on amlodipine and losartan. He reports {excellent/good/fair/poor:19665} compliance with treatment. He {is/is not:9024} having side effects. {document side effects if present:1} He {is/is not:9024} exercising. He {is/is not:9024} adherent to low salt diet.   Outside blood pressures are {enter patient reported home BP, or 'not being checked':1}.  He {does/does not:200015} smoke.  Use of agents associated with hypertension: {bp agents assoc with hypertension:511}.   --------------------------------------------------------------------------------------------------- Lipid/Cholesterol, follow-up  Last Lipid Panel: Lab Results  Component Value Date   CHOL 199 09/18/2020   LDLCALC 99 09/18/2020   LDLDIRECT 97 09/18/2020   HDL 55 09/18/2020   TRIG 269 (H) 09/18/2020    He was last seen for this 3 months ago.  Management since that visit includes; labs checked showing-improvement.  He reports {excellent/good/fair/poor:19665} compliance with treatment. He {is/is not:9024} having side effects. {document side effects if present:1}  He is following a {diet:21022986} diet. Current exercise: {exercise types:16438}  Last metabolic panel Lab Results  Component Value Date   GLUCOSE 92 09/18/2020   NA 142 09/18/2020   K 3.9 09/18/2020   BUN 18 09/18/2020   CREATININE 1.29 (H)  09/18/2020   GFRNONAA 90 05/11/2020   GFRAA 104 05/11/2020   CALCIUM 8.3 (L) 09/18/2020   AST 29 09/18/2020   ALT 27 09/18/2020   The 10-year ASCVD risk score Denman George DC Jr., et al., 2013) is: 14.5%  --------------------------------------------------------------------------------------------------- Prediabetes, Follow-up  Lab Results  Component Value Date   HGBA1C 5.6 09/18/2020   HGBA1C 5.5 10/09/2017   GLUCOSE 92 09/18/2020   GLUCOSE 143 (H) 05/11/2020   GLUCOSE 111 (H) 10/29/2018    Last seen for for this3 months ago.  Management since that visit includes; labs checked showing improvement. Current symptoms include {Symptoms; diabetes:14075} and have been {Desc; course:15616}.  Prior visit with dietician: {yes/no:17258} Current diet: {diet habits:16563} Current exercise: {exercise types:16438}  Pertinent Labs:    Component Value Date/Time   CHOL 199 09/18/2020 1612   TRIG 269 (H) 09/18/2020 1612   CHOLHDL 3.6 09/18/2020 1612   CREATININE 1.29 (H) 09/18/2020 1612    Wt Readings from Last 3 Encounters:  09/18/20 192 lb (87.1 kg)  05/15/20 190 lb (86.2 kg)  02/25/20 195 lb (88.5 kg)    -----------------------------------------------------------------------------------------   {Show patient history (optional):23778}   Medications: Outpatient Medications Prior to Visit  Medication Sig   amLODipine (NORVASC) 5 MG tablet TAKE 1 TABLET BY MOUTH EVERY DAY IN THE EVENING   co-enzyme Q-10 50 MG capsule Take 50 mg by mouth daily.   losartan (COZAAR) 100 MG tablet Take 1 tablet (100 mg total) by mouth daily.   Melatonin 10 MG TABS Take 1 Dose by mouth at bedtime as needed. gummies (Patient not taking: Reported on 09/18/2020)   oxyCODONE-acetaminophen (PERCOCET/ROXICET) 5-325 MG tablet Take 1 tablet by mouth every  4 (four) hours as needed (back pain).   rosuvastatin (CRESTOR) 10 MG tablet TAKE 1 TABLET BY MOUTH EVERY DAY   No facility-administered medications prior to  visit.    Review of Systems  Constitutional:  Negative for appetite change, chills and fever.  Respiratory:  Negative for chest tightness, shortness of breath and wheezing.   Cardiovascular:  Negative for chest pain and palpitations.  Gastrointestinal:  Negative for abdominal pain, nausea and vomiting.   {Labs  Heme  Chem  Endocrine  Serology  Results Review (optional):23779}   Objective    There were no vitals taken for this visit. {Show previous vital signs (optional):23777}   Physical Exam  ***  No results found for any visits on 12/11/20.  Assessment & Plan     ***  No follow-ups on file.      {provider attestation***:1}   [name], MD  [facility] [phone] (phone) [phone] (fax)  [facility]

## 2020-12-18 ENCOUNTER — Other Ambulatory Visit: Payer: Self-pay | Admitting: Family Medicine

## 2020-12-18 DIAGNOSIS — M48062 Spinal stenosis, lumbar region with neurogenic claudication: Secondary | ICD-10-CM

## 2020-12-18 DIAGNOSIS — M533 Sacrococcygeal disorders, not elsewhere classified: Secondary | ICD-10-CM

## 2020-12-18 DIAGNOSIS — M47816 Spondylosis without myelopathy or radiculopathy, lumbar region: Secondary | ICD-10-CM

## 2020-12-18 MED ORDER — OXYCODONE-ACETAMINOPHEN 5-325 MG PO TABS: 1.0000 | ORAL_TABLET | ORAL | 0 refills | Status: DC | PRN

## 2020-12-18 NOTE — Telephone Encounter (Signed)
Requested medication (s) are due for refill today: yes   Requested medication (s) are on the active medication list: yes   Last refill:  11/13/2020  Future visit scheduled: no  Notes to clinic:  this refill cannot be delegated    Requested Prescriptions  Pending Prescriptions Disp Refills   oxyCODONE-acetaminophen (PERCOCET/ROXICET) 5-325 MG tablet 160 tablet 0    Sig: Take 1 tablet by mouth every 4 (four) hours as needed (back pain).     Not Delegated - Analgesics:  Opioid Agonist Combinations Failed - 12/18/2020 11:09 AM      Failed - This refill cannot be delegated      Failed - Urine Drug Screen completed in last 360 days      Passed - Valid encounter within last 6 months    Recent Outpatient Visits           3 months ago Hyperlipidemia, unspecified hyperlipidemia type   [facility] Malva Limes, MD   7 months ago Essential (primary) hypertension   [facility] Malva Limes, MD   9 months ago Essential (primary) hypertension   [facility] Malva Limes, MD   1 year ago Essential (primary) hypertension   [facility] Malva Limes, MD   1 year ago Dysuria   [facility] Chrismon, Jodell Cipro, [place]

## 2020-12-18 NOTE — Telephone Encounter (Signed)
Medication Refill - Medication: Oxycodone 5 mg  Has the patient contacted their pharmacy? No. (Agent: If no, request that the patient contact the pharmacy for the refill.) (Agent: If yes, when and what did the pharmacy advise?)  Preferred Pharm[facility] (with phone number [facility]  Agent: Please be advised that RX refills may take up to 3 business days. We ask that you follow-up with your pharmacy.

## 2021-01-07 ENCOUNTER — Other Ambulatory Visit: Payer: Self-pay | Admitting: Family Medicine

## 2021-01-07 DIAGNOSIS — I10 Essential (primary) hypertension: Secondary | ICD-10-CM

## 2021-01-19 ENCOUNTER — Other Ambulatory Visit: Payer: Self-pay | Admitting: Family Medicine

## 2021-01-19 DIAGNOSIS — M47816 Spondylosis without myelopathy or radiculopathy, lumbar region: Secondary | ICD-10-CM

## 2021-01-19 DIAGNOSIS — M48062 Spinal stenosis, lumbar region with neurogenic claudication: Secondary | ICD-10-CM

## 2021-01-19 DIAGNOSIS — M533 Sacrococcygeal disorders, not elsewhere classified: Secondary | ICD-10-CM

## 2021-01-19 NOTE — Telephone Encounter (Signed)
Medication Refill - Medication:   oxyCODONE-acetaminophen (PERCOCET/ROXICET) 5-325 MG tablet   Has the patient contacted their pharmacy? Need doctor approval.   Preferred Pharmacy (with phone number or street name):  [id number]ell Halim [address]  [address], [place] [address]  Phone:  [phone]  Fax:  [phone]   Agent: Please be advised that RX refills may take up to 3 business days. We ask that you follow-up with your pharmacy.

## 2021-01-19 NOTE — Telephone Encounter (Signed)
Requested medication (s) are due for refill today: yes  Requested medication (s) are on the active medication list:yes    Last refill: 12/18/20  #160  0 refills  Future visit scheduled yes 03/09/21  Notes to clinic: not delegated  Requested Prescriptions  Pending Prescriptions Disp Refills   oxyCODONE-acetaminophen (PERCOCET/ROXICET) 5-325 MG tablet 160 tablet 0    Sig: Take 1 tablet by mouth every 4 (four) hours as needed (back pain).     Not Delegated - Analgesics:  Opioid Agonist Combinations Failed - 01/19/2021  4:03 PM      Failed - This refill cannot be delegated      Failed - Urine Drug Screen completed in last 360 days      Passed - Valid encounter within last 6 months    Recent Outpatient Visits           4 months ago Hyperlipidemia, unspecified hyperlipidemia type   [facility] Malva Limes, MD   8 months ago Essential (primary) hypertension   [facility] Malva Limes, MD   10 months ago Essential (primary) hypertension   [facility] Malva Limes, MD   1 year ago Essential (primary) hypertension   [facility] Malva Limes, MD   1 year ago Dysuria   [facility] Chrismon, Jodell Cipro, PA-C       Future Appointments             In 1 month Fisher, Demetrios Isaacs, MD [facility]

## 2021-01-20 MED ORDER — OXYCODONE-ACETAMINOPHEN 5-325 MG PO TABS: 1.0000 | ORAL_TABLET | ORAL | 0 refills | Status: DC | PRN

## 2021-02-09 ENCOUNTER — Ambulatory Visit: Payer: Self-pay | Admitting: *Deleted

## 2021-02-09 NOTE — Telephone Encounter (Signed)
Per agent:  "Pt is calling and was taking COQ-10 50 mg and brought 100 mg by mistake and would like to know if its ok to take 100 mg "  Pt states Dr. Sherrie Mustache recommended he take supplement. States gel form capsule, not able to half. Wants to make sure 100mg  is OK to take or stay with the 50mg .  CB# [phone] Please advise.

## 2021-02-09 NOTE — Telephone Encounter (Signed)
100 mg is fine

## 2021-02-09 NOTE — Telephone Encounter (Signed)
Pt returned call, message read. Verbalizes understanding.

## 2021-02-09 NOTE — Telephone Encounter (Signed)
Tried calling; pt's mailbox is full.  PEC please advise pt when he calls back.   Thanks,   -Vernona Rieger

## 2021-02-09 NOTE — Telephone Encounter (Signed)
Pt is calling and was taking COQ-10 50 mg and brought 100 mg by mistake and would like to know if its ok to take 100 mg  Reason for Disposition  [1] Caller has NON-URGENT medicine question about med that PCP prescribed AND [2] triager unable to answer question  Answer Assessment - Initial Assessment Questions 1. NAME of MEDICATION: "What medicine are you calling about?"     OQ10 - 50mg  2. QUESTION: "What is your question?" (e.g., double dose of medicine, side effect)     Bought 100mg  by mistake, OK to take? 3. PRESCRIBING HCP: "Who prescribed it?" Reason: if prescribed by specialist, call should be referred to that group.     Dr recommended. 4. SYMPTOMS: "Do you have any symptoms?"      5. SEVERITY: If symptoms are present, ask "Are they mild, moderate or severe?"  Protocols used: Medication Question Call-A-AH

## 2021-02-22 ENCOUNTER — Other Ambulatory Visit: Payer: Self-pay | Admitting: Family Medicine

## 2021-02-22 DIAGNOSIS — M533 Sacrococcygeal disorders, not elsewhere classified: Secondary | ICD-10-CM

## 2021-02-22 DIAGNOSIS — M47816 Spondylosis without myelopathy or radiculopathy, lumbar region: Secondary | ICD-10-CM

## 2021-02-22 DIAGNOSIS — M48062 Spinal stenosis, lumbar region with neurogenic claudication: Secondary | ICD-10-CM

## 2021-02-22 NOTE — Telephone Encounter (Signed)
Copied from CRM [id number]. Topic: Quick Communication - Rx Refill/Question >> Feb 22, 2021 11:01 AM [name] wrote: Medication: oxyCODONE-acetaminophen (PERCOCET/ROXICET) 5-325 MG tablet  Has the patient contacted their pharmacy? Yes.  Pt told to contact provider (Agent: If no, request that the patient contact the pharmacy for the refill.) (Agent: If yes, when and what did the pharmacy advise?)  Preferred Pharmacy (with phone number or street name): [id number] Hassell Halim [address]  Phone: [phone]   Fax: [phone]  Has the patient been seen for an appointment in the last year OR does the patient have an upcoming appointment? Yes.    Agent: Please be advised that RX refills may take up to 3 business days. We ask that you follow-up with your pharmacy.

## 2021-02-22 NOTE — Telephone Encounter (Signed)
Requested medications are due for refill today.  yes  Requested medications are on the active medications list.  yes  Last refill. 01/20/2021  Future visit scheduled.   yes  Notes to clinic.  Medication not delegated. 

## 2021-02-23 MED ORDER — OXYCODONE-ACETAMINOPHEN 5-325 MG PO TABS: 1.0000 | ORAL_TABLET | ORAL | 0 refills | Status: DC | PRN

## 2021-03-09 ENCOUNTER — Ambulatory Visit (INDEPENDENT_AMBULATORY_CARE_PROVIDER_SITE_OTHER): Payer: Self-pay | Admitting: Family Medicine

## 2021-03-09 ENCOUNTER — Other Ambulatory Visit: Payer: Self-pay

## 2021-03-09 ENCOUNTER — Encounter: Payer: Self-pay | Admitting: Family Medicine

## 2021-03-09 VITALS — BP 166/64 | HR 69 | Resp 18 | Wt 200.0 lb

## 2021-03-09 DIAGNOSIS — I10 Essential (primary) hypertension: Secondary | ICD-10-CM

## 2021-03-09 DIAGNOSIS — R739 Hyperglycemia, unspecified: Secondary | ICD-10-CM

## 2021-03-09 DIAGNOSIS — E785 Hyperlipidemia, unspecified: Secondary | ICD-10-CM

## 2021-03-09 LAB — POCT GLYCOSYLATED HEMOGLOBIN (HGB A1C)
Est. average glucose Bld gHb Est-mCnc: 108
Hemoglobin A1C: 5.4 % (ref 4.0–5.6)

## 2021-03-09 MED ORDER — AMLODIPINE BESYLATE 5 MG PO TABS
2.5000 mg | ORAL_TABLET | Freq: Every day | ORAL | Status: DC
Start: 2021-03-09 — End: 2022-01-12

## 2021-03-09 MED ORDER — HYDROCHLOROTHIAZIDE 12.5 MG PO TABS: 12.5000 mg | ORAL_TABLET | Freq: Every day | ORAL | 1 refills | Status: DC

## 2021-03-09 NOTE — Progress Notes (Signed)
Established patient visit   Patient: Alexander Mcbride   DOB: [birth year]   65 y.o. Male  MRN: [id number] Visit Date: 03/09/2021  Today's healthcare provider: [name], MD   Chief Complaint  Patient presents with   Hypertension   Hyperlipidemia   Hyperglycemia   Subjective    HPI  Hypertension, follow-up  BP Readings from Last 3 Encounters:  03/09/21 (!) 166/64  09/18/20 (!) 144/79  05/15/20 (!) 142/74   Wt Readings from Last 3 Encounters:  03/09/21 200 lb (90.7 kg)  09/18/20 192 lb (87.1 kg)  05/15/20 190 lb (86.2 kg)     He was last seen for hypertension 6 months ago.  BP at that visit was 144/79. Management since that visit includes no changes.  He reports good compliance with treatment. He is having side effects. (Leg swelling) He is following a Regular diet. He is exercising. He does not smoke.  Use of agents associated with hypertension: none.   Outside blood pressures are not checked.  --------------------------------------------------------------------------------------------------- Lipid/Cholesterol, Follow-up  Last lipid panel Other pertinent labs  Lab Results  Component Value Date   CHOL 199 09/18/2020   HDL 55 09/18/2020   LDLCALC 99 09/18/2020   LDLDIRECT 97 09/18/2020   TRIG 269 (H) 09/18/2020   CHOLHDL 3.6 09/18/2020   Lab Results  Component Value Date   ALT 27 09/18/2020   AST 29 09/18/2020   PLT 283 10/17/2013     He was last seen for this 6 months ago.  Management since that visit includes no changes.  He reports good compliance with treatment. He is not having side effects.   Symptoms: No chest pain No chest pressure/discomfort  No dyspnea Yes lower extremity edema  No numbness or tingling of extremity No orthopnea  No palpitations No paroxysmal nocturnal dyspnea  No speech difficulty No syncope   The 10-year ASCVD risk score (Arnett DK, et al., 2019) is:  18.4%  --------------------------------------------------------------------------------------------------- Prediabetes, Follow-up  Lab Results  Component Value Date   HGBA1C 5.6 09/18/2020   HGBA1C 5.5 10/09/2017   GLUCOSE 92 09/18/2020   GLUCOSE 143 (H) 05/11/2020   GLUCOSE 111 (H) 10/29/2018    Last seen for for this 6 months ago.  Management since that visit includes no changes. Current symptoms include none and have been stable.   Wt Readings from Last 3 Encounters:  03/09/21 200 lb (90.7 kg)  09/18/20 192 lb (87.1 kg)  05/15/20 190 lb (86.2 kg)    -----------------------------------------------------------------------------------------     Medications: Outpatient Medications Prior to Visit  Medication Sig   amLODipine (NORVASC) 5 MG tablet TAKE 1 TABLET BY MOUTH EVERY DAY IN THE EVENING   co-enzyme Q-10 50 MG capsule Take 50 mg by mouth daily.   CREATINE PO Take 1 Dose by mouth daily.   losartan (COZAAR) 100 MG tablet Take 1 tablet (100 mg total) by mouth daily.   Nutritional Supp - Diet Aids (FAT BURNER THERAPY PO) Take 1 Scoop by mouth daily.   oxyCODONE-acetaminophen (PERCOCET/ROXICET) 5-325 MG tablet Take 1 tablet by mouth every 4 (four) hours as needed (back pain).   rosuvastatin (CRESTOR) 10 MG tablet TAKE 1 TABLET BY MOUTH EVERY DAY   [DISCONTINUED] Melatonin 10 MG TABS Take 1 Dose by mouth at bedtime as needed. gummies (Patient not taking: Reported on 03/09/2021)   No facility-administered medications prior to visit.    Review of Systems  Constitutional:  Negative for appetite change, chills and fever.  Respiratory:  Negative for chest tightness, shortness of breath and wheezing.   Cardiovascular:  Positive for leg swelling (worse in left leg). Negative for chest pain and palpitations.  Gastrointestinal:  Negative for abdominal pain, nausea and vomiting.      Objective    BP (!) 166/64 (BP Location: Right Arm, Patient Position: Sitting, Cuff Size:  Large)   Pulse 69   Resp 18   Wt 200 lb (90.7 kg)   SpO2 99% Comment: room air  BMI 30.41 kg/m    Physical Exam   General appearance: Mildly obese male, cooperative and in no acute distress Head: Normocephalic, without obvious abnormality, atraumatic Respiratory: Respirations even and unlabored, normal respiratory rate Extremities: All extremities are intact. 2+ edema left lower leg and ankle. 1+ edema right.  Skin: Skin color, texture, turgor normal. No rashes seen  Psych: Appropriate mood and affect. Neurologic: Mental status: Alert, oriented to person, place, and time, thought content appropriate.     Assessment & Plan     1. Essential (primary) hypertension His main concern today is swelling in his legs since being on amlodipine but BP remains well above target. Will reduce  amLODipine (NORVASC) 5 MG tablet; to Take 0.5 tablets (2.5 mg total) by mouth daily. Add  hydrochlorothiazide (HYDRODIURIL) 12.5 MG tablet; Take 1 tablet (12.5 mg total) by mouth daily.  Dispense: 90 tablet; Refill: 1  Follow up for  BP check next month.  2. Hyperlipidemia, unspecified hyperlipidemia type He is tolerating rosuvastatin well with no adverse effects.    3. Hyperglycemia Will check a1c at follow up visit.       The entirety of the information documented in the History of Present Illness, Review of Systems and Physical Exam were personally obtained by me. Portions of this information were initially documented by the CMA and reviewed by me for thoroughness and accuracy.     [name], MD  [facility] [phone] (phone) [phone] (fax)  [facility]

## 2021-03-09 NOTE — Patient Instructions (Signed)
Please review the attached list of medications and notify my office if there are any errors.   Reduced amlodipine to 1/2 tablet every day. Continue losartan 100mg  every day, and add hydrochlorothiazide 12.5mg  one tablet every morning

## 2021-03-26 ENCOUNTER — Telehealth: Payer: Self-pay

## 2021-03-26 ENCOUNTER — Other Ambulatory Visit: Payer: Self-pay | Admitting: Family Medicine

## 2021-03-26 DIAGNOSIS — M533 Sacrococcygeal disorders, not elsewhere classified: Secondary | ICD-10-CM

## 2021-03-26 DIAGNOSIS — M48062 Spinal stenosis, lumbar region with neurogenic claudication: Secondary | ICD-10-CM

## 2021-03-26 DIAGNOSIS — M47816 Spondylosis without myelopathy or radiculopathy, lumbar region: Secondary | ICD-10-CM

## 2021-03-26 NOTE — Telephone Encounter (Signed)
Called multiple times to sch. But phone VM is full and cannot leave message.

## 2021-03-26 NOTE — Telephone Encounter (Signed)
Requested medication (s) are due for refill today: Yes  Requested medication (s) are on the active medication list: Yes  Last refill:  02/23/21 #160/0RF  Future visit scheduled: No  Notes to clinic:  Unable to refill per protocol, cannot delegate. No Urine drug screen on file.      Requested Prescriptions  Pending Prescriptions Disp Refills   oxyCODONE-acetaminophen (PERCOCET/ROXICET) 5-325 MG tablet 160 tablet 0    Sig: Take 1 tablet by mouth every 4 (four) hours as needed (back pain).     Not Delegated - Analgesics:  Opioid Agonist Combinations Failed - 03/26/2021 10:41 AM      Failed - This refill cannot be delegated      Failed - Urine Drug Screen completed in last 360 days      Passed - Valid encounter within last 6 months    Recent Outpatient Visits           2 weeks ago Essential (primary) hypertension   [facility] Malva Limes, MD   6 months ago Hyperlipidemia, unspecified hyperlipidemia type   [facility] Malva Limes, MD   10 months ago Essential (primary) hypertension   [facility] Malva Limes, MD   1 year ago Essential (primary) hypertension   [facility] Malva Limes, MD   1 year ago Essential (primary) hypertension   [facility] Fisher, Demetrios Isaacs, MD

## 2021-03-26 NOTE — Telephone Encounter (Signed)
LOV: 03/09/2021  NOV: 04/13/2021  Thanks,   -Vernona Rieger

## 2021-03-26 NOTE — Telephone Encounter (Signed)
Copied from CRM [id number]. Topic: Quick Communication - Rx Refill/Question >> Mar 26, 2021 10:14 AM Gaetana Michaelis A wrote: Medication: oxyCODONE-acetaminophen (PERCOCET/ROXICET) 5-325 MG tablet   Has the patient contacted their pharmacy? No. (Agent: If no, request that the patient contact the pharmacy for the refill. If patient does not wish to contact the pharmacy document the reason why and proceed with request.) (Agent: If yes, when and what did the pharmacy advise?)  Preferred Pharmacy (with phone number or street name): [id number] Hassell Halim [address]  Phone:  [phone] Fax:  [phone]    Has the patient been seen for an appointment in the last year OR does the patient have an upcoming appointment? Yes.    Agent: Please be advised that RX refills may take up to 3 business days. We ask that you follow-up with your pharmacy.

## 2021-03-26 NOTE — Telephone Encounter (Signed)
Copied from CRM [id number]. Topic: Appointment Scheduling - Scheduling Inquiry for Clinic >> Mar 26, 2021 10:16 AM Alexander Mcbride A wrote: Reason for CRM: The patient would like to receive a flu shot  The agent was unable to successfully schedule at the time of call  Please contact further when possible

## 2021-03-28 MED ORDER — OXYCODONE-ACETAMINOPHEN 5-325 MG PO TABS: 1.0000 | ORAL_TABLET | ORAL | 0 refills | Status: DC | PRN

## 2021-04-13 ENCOUNTER — Telehealth: Payer: Self-pay

## 2021-04-13 ENCOUNTER — Ambulatory Visit: Payer: Self-pay | Admitting: Family Medicine

## 2021-04-13 NOTE — Telephone Encounter (Signed)
Copied from CRM [id number]. Topic: Appointment Scheduling - Scheduling Inquiry for Clinic >> Apr 13, 2021 12:52 PM Pawlus, Maxine Glenn A wrote: Reason for CRM: Pt called needing to reschedule his same day appt that was for today 12/6, please advise if pt can be worked in sometime this week.

## 2021-04-13 NOTE — Telephone Encounter (Signed)
Tried calling patient to reschedule missed appointment that was scheduled for today. No answer or voice message system. He was supposed to have been seen for a 5-6 week f/u on BP. If patient returns call, please schedule appointment for blood pressure follow up.

## 2021-04-13 NOTE — Progress Notes (Deleted)
      Established patient visit   Patient: Alexander Mcbride   DOB: 1956-01-04   65 y.o. Male  MRN: [id number] Visit Date: 04/13/2021  Today's healthcare provider: Lelon Huh, MD   No chief complaint on file.  Subjective    HPI  Hypertension, follow-up  BP Readings from Last 3 Encounters:  03/09/21 (!) 166/64  09/18/20 (!) 144/79  05/15/20 (!) 142/74   Wt Readings from Last 3 Encounters:  03/09/21 200 lb (90.7 kg)  09/18/20 192 lb (87.1 kg)  05/15/20 190 lb (86.2 kg)     He was last seen for hypertension 03/09/2021.   BP at that visit was 166/64. Management since that visit includes reducing amLODipine (NORVASC) 5 MG tablet; to Take 0.5 tablets (2.5 mg total) by mouth daily due to swelling. Also added  hydrochlorothiazide (HYDRODIURIL) 12.5 MG tablet; Take 1 tablet (12.5 mg total) by mouth daily.  He reports {excellent/good/fair/poor:19665} compliance with treatment. He {is/is not:9024} having side effects. {document side effects if present:1} He is following a {diet:21022986} diet. He {is/is not:9024} exercising. He {does/does not:200015} smoke.  Use of agents associated with hypertension: none.   Outside blood pressures are {***enter patient reported home BP readings, or 'not being checked':1}. Symptoms: {Yes/No:20286} chest pain {Yes/No:20286} chest pressure  {Yes/No:20286} palpitations {Yes/No:20286} syncope  {Yes/No:20286} dyspnea {Yes/No:20286} orthopnea  {Yes/No:20286} paroxysmal nocturnal dyspnea {Yes/No:20286} lower extremity edema   Pertinent labs: Lab Results  Component Value Date   CHOL 199 09/18/2020   HDL 55 09/18/2020   LDLCALC 99 09/18/2020   LDLDIRECT 97 09/18/2020   TRIG 269 (H) 09/18/2020   CHOLHDL 3.6 09/18/2020   Lab Results  Component Value Date   NA 142 09/18/2020   K 3.9 09/18/2020   CREATININE 1.29 (H) 09/18/2020   EGFR 62 09/18/2020   GLUCOSE 92 09/18/2020     The 10-year ASCVD risk score (Arnett DK, et al., 2019) is:  18.4%   ---------------------------------------------------------------------------------------------------   {Link to patient history deactivated due to formatting error:1}  Medications: Outpatient Medications Prior to Visit  Medication Sig   amLODipine (NORVASC) 5 MG tablet Take 0.5 tablets (2.5 mg total) by mouth daily.   co-enzyme Q-10 50 MG capsule Take 50 mg by mouth daily.   CREATINE PO Take 1 Dose by mouth daily.   hydrochlorothiazide (HYDRODIURIL) 12.5 MG tablet Take 1 tablet (12.5 mg total) by mouth daily.   losartan (COZAAR) 100 MG tablet Take 1 tablet (100 mg total) by mouth daily.   Nutritional Supp - Diet Aids (FAT BURNER THERAPY PO) Take 1 Scoop by mouth daily.   oxyCODONE-acetaminophen (PERCOCET/ROXICET) 5-325 MG tablet Take 1 tablet by mouth every 4 (four) hours as needed (back pain).   rosuvastatin (CRESTOR) 10 MG tablet TAKE 1 TABLET BY MOUTH EVERY DAY   No facility-administered medications prior to visit.    Review of Systems  {Labs  Heme  Chem  Endocrine  Serology  Results Review (optional):23779}   Objective    There were no vitals taken for this visit. {Show previous vital signs (optional):23777}  Physical Exam  ***  No results found for any visits on 04/13/21.  Assessment & Plan     ***  No follow-ups on file.      {provider attestation***:1}   Lelon Huh, MD  [facility] [phone] (phone) [phone] (fax)  [place]

## 2021-05-04 ENCOUNTER — Other Ambulatory Visit: Payer: Self-pay | Admitting: Family Medicine

## 2021-05-04 DIAGNOSIS — M48062 Spinal stenosis, lumbar region with neurogenic claudication: Secondary | ICD-10-CM

## 2021-05-04 DIAGNOSIS — M533 Sacrococcygeal disorders, not elsewhere classified: Secondary | ICD-10-CM

## 2021-05-04 DIAGNOSIS — M47816 Spondylosis without myelopathy or radiculopathy, lumbar region: Secondary | ICD-10-CM

## 2021-05-04 MED ORDER — OXYCODONE-ACETAMINOPHEN 5-325 MG PO TABS: 1.0000 | ORAL_TABLET | ORAL | 0 refills | Status: DC | PRN

## 2021-05-04 NOTE — Telephone Encounter (Signed)
Copied from CRM [id number]. Topic: Quick Communication - Rx Refill/Question >> May 04, 2021  9:17 AM Gaetana Michaelis A wrote: Medication: oxyCODONE-acetaminophen (PERCOCET/ROXICET) 5-325 MG tablet [[id number]]   Has the patient contacted their pharmacy? No. (Agent: If no, request that the patient contact the pharmacy for the refill. If patient does not wish to contact the pharmacy document the reason why and proceed with request.) (Agent: If yes, when and what did the pharmacy advise?)  Preferred Pharmacy (with phone number or street name): [phone] Hassell Halim [address] [place] [address] Phone: [phone] Fax: [phone]   Has the patient been seen for an appointment in the last year OR does the patient have an upcoming appointment? Yes.    Agent: Please be advised that RX refills may take up to 3 business days. We ask that you follow-up with your pharmacy.

## 2021-05-04 NOTE — Telephone Encounter (Signed)
Please review for refill. Refill not delegated per protocol.  Last refill: 03/28/21 #160

## 2021-06-07 ENCOUNTER — Other Ambulatory Visit: Payer: Self-pay | Admitting: Family Medicine

## 2021-06-07 DIAGNOSIS — M533 Sacrococcygeal disorders, not elsewhere classified: Secondary | ICD-10-CM

## 2021-06-07 DIAGNOSIS — M48062 Spinal stenosis, lumbar region with neurogenic claudication: Secondary | ICD-10-CM

## 2021-06-07 DIAGNOSIS — E785 Hyperlipidemia, unspecified: Secondary | ICD-10-CM

## 2021-06-07 DIAGNOSIS — M47816 Spondylosis without myelopathy or radiculopathy, lumbar region: Secondary | ICD-10-CM

## 2021-06-07 NOTE — Telephone Encounter (Signed)
Requested medication (s) are due for refill today: yes  Requested medication (s) are on the active medication list: yes    Last refill: 05/04/21  #160  0 refills  Future visit scheduled yes 07/06/21  Notes to clinic:Not delegated, please review  Requested Prescriptions  Pending Prescriptions Disp Refills   oxyCODONE-acetaminophen (PERCOCET/ROXICET) 5-325 MG tablet 160 tablet 0    Sig: Take 1 tablet by mouth every 4 (four) hours as needed (back pain).     Not Delegated - Analgesics:  Opioid Agonist Combinations Failed - 06/07/2021 12:15 PM      Failed - This refill cannot be delegated      Failed - Urine Drug Screen completed in last 360 days      Passed - Valid encounter within last 6 months    Recent Outpatient Visits           3 months ago Essential (primary) hypertension   [facility] Malva Limes, MD   8 months ago Hyperlipidemia, unspecified hyperlipidemia type   [facility] Malva Limes, MD   1 year ago Essential (primary) hypertension   [facility] Malva Limes, MD   1 year ago Essential (primary) hypertension   [facility] Malva Limes, MD   1 year ago Essential (primary) hypertension   [facility] Fisher, Demetrios Isaacs, MD       Future Appointments             In 4 weeks Fisher, Demetrios Isaacs, MD [facility]

## 2021-06-07 NOTE — Telephone Encounter (Signed)
Medication Refill - Medication:   oxyCODONE-acetaminophen (PERCOCET/ROXICET) 5-325 MG tablet  Has the patient contacted their pharmacy? No. Pt stated he contacts the office for refills/ controlled substance.     Preferred Pharmacy (with phone number or street name):   [id number] Hassell Halim [address]  [address] [place] [address]  Phone:  [phone]  Fax:  [phone]    Has the patient been seen for an appointment in the last year OR does the patient have an upcoming appointment? Yes.    Agent: Please be advised that RX refills may take up to 3 business days. We ask that you follow-up with your pharmacy.

## 2021-06-08 MED ORDER — OXYCODONE-ACETAMINOPHEN 5-325 MG PO TABS: 1.0000 | ORAL_TABLET | ORAL | 0 refills | Status: DC | PRN

## 2021-07-01 ENCOUNTER — Other Ambulatory Visit: Payer: Self-pay | Admitting: Family Medicine

## 2021-07-01 DIAGNOSIS — M48062 Spinal stenosis, lumbar region with neurogenic claudication: Secondary | ICD-10-CM

## 2021-07-01 DIAGNOSIS — M533 Sacrococcygeal disorders, not elsewhere classified: Secondary | ICD-10-CM

## 2021-07-01 DIAGNOSIS — M47816 Spondylosis without myelopathy or radiculopathy, lumbar region: Secondary | ICD-10-CM

## 2021-07-01 NOTE — Telephone Encounter (Signed)
Requested medication (s) are due for refill today - yes  Requested medication (s) are on the active medication list -yes  Future visit scheduled -yes  Last refill: 06/08/21 #160  Notes to clinic: Request RF: non delegated Rx  Requested Prescriptions  Pending Prescriptions Disp Refills   oxyCODONE-acetaminophen (PERCOCET/ROXICET) 5-325 MG tablet 160 tablet 0    Sig: Take 1 tablet by mouth every 4 (four) hours as needed (back pain).     Not Delegated - Analgesics:  Opioid Agonist Combinations Failed - 07/01/2021  4:29 PM      Failed - This refill cannot be delegated      Failed - Urine Drug Screen completed in last 360 days      Failed - Valid encounter within last 3 months    Recent Outpatient Visits           3 months ago Essential (primary) hypertension   [facility] Birdie Sons, MD   9 months ago Hyperlipidemia, unspecified hyperlipidemia type   [facility] Birdie Sons, MD   1 year ago Essential (primary) hypertension   [facility] Birdie Sons, MD   1 year ago Essential (primary) hypertension   [facility] Birdie Sons, MD   2 years ago Essential (primary) hypertension   [facility] Birdie Sons, MD       Future Appointments             In 5 days Fisher, Kirstie Peri, MD [facility]               Requested Prescriptions  Pending Prescriptions Disp Refills   oxyCODONE-acetaminophen (PERCOCET/ROXICET) 5-325 MG tablet 160 tablet 0    Sig: Take 1 tablet by mouth every 4 (four) hours as needed (back pain).     Not Delegated - Analgesics:  Opioid Agonist Combinations Failed - 07/01/2021  4:29 PM      Failed - This refill cannot be delegated      Failed - Urine Drug Screen completed in last 360 days      Failed - Valid encounter within last 3 months    Recent Outpatient Visits           3 months ago Essential (primary) hypertension   [facility] Birdie Sons, MD   9 months ago Hyperlipidemia, unspecified hyperlipidemia type   [facility] Birdie Sons, MD   1 year ago Essential (primary) hypertension   [facility] Birdie Sons, MD   1 year ago Essential (primary) hypertension   [facility] Birdie Sons, MD   2 years ago Essential (primary) hypertension   [place], Kirstie Peri, MD       Future Appointments             In 5 days Fisher, Kirstie Peri, MD [facility] [place]

## 2021-07-01 NOTE — Telephone Encounter (Signed)
Medication Refill - Medication:   oxyCODONE-acetaminophen (PERCOCET/ROXICET) 5-325 MG tablet  Has the patient contacted their pharmacy? Yes.  Contact pcp office.  (Agent: If no, request that the patient contact the pharmacy for the refill. If patient does not wish to contact the pharmacy document the reason why and proceed with request.) (Agent: If yes, when and what did the pharmacy advise?)  Preferred Pharmacy (with phone number or street name):   [phone] Hassell Halim [address]  [address] [place] [address]  Phone: [phone] Fax: [phone]    Has the patient been seen for an appointment in the last year OR does the patient have an upcoming appointment? Yes.    Agent: Please be advised that RX refills may take up to 3 business days. We ask that you follow-up with your pharmacy.

## 2021-07-04 MED ORDER — OXYCODONE-ACETAMINOPHEN 5-325 MG PO TABS: 1.0000 | ORAL_TABLET | ORAL | 0 refills | Status: DC | PRN

## 2021-07-06 ENCOUNTER — Ambulatory Visit (INDEPENDENT_AMBULATORY_CARE_PROVIDER_SITE_OTHER): Payer: BC Managed Care – PPO | Admitting: Family Medicine

## 2021-07-06 ENCOUNTER — Encounter: Payer: Self-pay | Admitting: Family Medicine

## 2021-07-06 ENCOUNTER — Other Ambulatory Visit: Payer: Self-pay

## 2021-07-06 VITALS — BP 149/81 | HR 89 | Temp 97.8°F | Resp 14 | Ht 67.0 in | Wt 199.3 lb

## 2021-07-06 DIAGNOSIS — Z125 Encounter for screening for malignant neoplasm of prostate: Secondary | ICD-10-CM | POA: Diagnosis not present

## 2021-07-06 DIAGNOSIS — M48062 Spinal stenosis, lumbar region with neurogenic claudication: Secondary | ICD-10-CM | POA: Diagnosis not present

## 2021-07-06 DIAGNOSIS — F1190 Opioid use, unspecified, uncomplicated: Secondary | ICD-10-CM | POA: Diagnosis not present

## 2021-07-06 DIAGNOSIS — E785 Hyperlipidemia, unspecified: Secondary | ICD-10-CM

## 2021-07-06 DIAGNOSIS — I10 Essential (primary) hypertension: Secondary | ICD-10-CM | POA: Diagnosis not present

## 2021-07-06 MED ORDER — MONTELUKAST SODIUM 10 MG PO TABS: 10.0000 mg | ORAL_TABLET | Freq: Every day | ORAL | 0 refills | Status: DC

## 2021-07-06 MED ORDER — MONTELUKAST SODIUM 10 MG PO TABS: 10.0000 mg | ORAL_TABLET | Freq: Every day | ORAL | 2 refills | Status: DC

## 2021-07-06 NOTE — Progress Notes (Signed)
Established patient visit   Patient: Alexander Mcbride   DOB: 1956-02-09   66 y.o. Male  MRN: [id number] Visit Date: 07/06/2021  Today's healthcare provider: Lelon Huh, MD   No chief complaint on file.  Subjective    HPI  Hypertension, follow-up  BP Readings from Last 3 Encounters:  07/06/21 (!) 157/79  03/09/21 (!) 166/64  09/18/20 (!) 144/79   Wt Readings from Last 3 Encounters:  07/06/21 199 lb 4.8 oz (90.4 kg)  03/09/21 200 lb (90.7 kg)  09/18/20 192 lb (87.1 kg)     He was last seen for hypertension 4 months ago.  BP at that visit was 166/64. Management since that visit includes reducing amLODipine (NORVASC) 5 MG tablet; to Take 0.5 tablets (2.5 mg total) by mouth daily.Add  hydrochlorothiazide (HYDRODIURIL) 12.5 MG tablet; Take 1 tablet (12.5 mg total) by mouth daily and follow up for BP check next month (Patient no-showed for this appointment 04/13/2021).  He reports good compliance with treatment. He is not having side effects. none He is following a Regular diet. He is exercising. He does not smoke.   Outside blood pressures are not checked regularly. When patient dos check them they run around [id number] for systolic, patient doesn't know diastolic number.      --------------------------------------------------------------------------------------------------- Follow up for Hyperglycemia  The patient was last seen for this 4 months ago. Changes made at last visit include; Will check a1c at follow up visit.   He reports good compliance with treatment. He feels that condition is Unchanged. He is not having side effects. none  ----------------------------------------------------------------------------------------- Lipid/Cholesterol, Follow-up  Last lipid panel Other pertinent labs  Lab Results  Component Value Date   CHOL 199 09/18/2020   HDL 55 09/18/2020   LDLCALC 99 09/18/2020   LDLDIRECT 97 09/18/2020   TRIG 269 (H) 09/18/2020    CHOLHDL 3.6 09/18/2020   Lab Results  Component Value Date   ALT 27 09/18/2020   AST 29 09/18/2020   PLT 283 10/17/2013     He was last seen for this 4 months ago.  Management since that visit includes.  He reports good compliance with treatment. He is not having side effects. none  Symptoms: No chest pain No chest pressure/discomfort  No dyspnea No lower extremity edema  No numbness or tingling of extremity No orthopnea  No palpitations No paroxysmal nocturnal dyspnea  No speech difficulty No syncope   Current diet: well balanced Current exercise: running/ jogging and walking  The 10-year ASCVD risk score (Arnett DK, et al., 2019) is: 18.1%  --------------------------------------------------------------------------------------------------- Medications: Outpatient Medications Prior to Visit  Medication Sig   amLODipine (NORVASC) 5 MG tablet Take 0.5 tablets (2.5 mg total) by mouth daily.   co-enzyme Q-10 50 MG capsule Take 50 mg by mouth daily.   hydrochlorothiazide (HYDRODIURIL) 12.5 MG tablet Take 1 tablet (12.5 mg total) by mouth daily.   losartan (COZAAR) 100 MG tablet Take 1 tablet (100 mg total) by mouth daily.   Nutritional Supp - Diet Aids (FAT BURNER THERAPY PO) Take 1 Scoop by mouth daily.   oxyCODONE-acetaminophen (PERCOCET/ROXICET) 5-325 MG tablet Take 1 tablet by mouth every 4 (four) hours as needed (back pain).   rosuvastatin (CRESTOR) 10 MG tablet TAKE 1 TABLET BY MOUTH EVERY DAY   CREATINE PO Take 1 Dose by mouth daily. (Patient not taking: Reported on 07/06/2021)   No facility-administered medications prior to visit.    Review of Systems  All other systems  reviewed and are negative.     Objective    BP (!) 157/79 (BP Location: Left Arm, Patient Position: Sitting, Cuff Size: Normal)    Pulse 89    Temp 97.8 F (36.6 C) (Oral)    Resp 14    Ht 5\' 7"  (1.702 m)    Wt 199 lb 4.8 oz (90.4 kg)    SpO2 97%    BMI 31.21 kg/m    Physical Exam   General:  Appearance:    Obese male in no acute distress  Eyes:    PERRL, conjunctiva/corneas clear, EOM's intact       Lungs:     Clear to auscultation bilaterally, respirations unlabored  Heart:    Normal heart rate. Normal rhythm. No murmurs, rubs, or gallops.    MS:   All extremities are intact.    Neurologic:   Awake, alert, oriented x 3. No apparent focal neurological defect.         Assessment & Plan     1. Essential (primary) hypertension  - Lipid panel - Renal function panel  He does admitted to frequently forgetting to take amlodipine. He is going to start taking it more consistently   2. Hyperlipidemia, unspecified hyperlipidemia type He is tolerating rosuvastatin well with no adverse effects.    3. Prostate cancer screening  - PSA Total (Reflex To Free)  4. Spinal stenosis, lumbar region, with neurogenic claudication   5. Chronic, continuous use of opioids Pain adequately controlled - Pain Mgt Scrn (14 Drugs), Ur  Chest congestion/cough post nasal drainage.  Try  montelukast (SINGULAIR) 10 MG tablet; Take 1 tablet (10 mg total) by mouth at bedtime.  Dispense: 30 tablet; Refill: 2       The entirety of the information documented in the History of Present Illness, Review of Systems and Physical Exam were personally obtained by me. Portions of this information were initially documented by the CMA and reviewed by me for thoroughness and accuracy.     Lelon Huh, MD  [facility] [phone] (phone) [phone] (fax)  [place]

## 2021-07-06 NOTE — Patient Instructions (Addendum)
Please review the attached list of medications and notify my office if there are any errors.   Please bring all of your medications to every appointment so we can make sure that our medication list is the same as yours.   Don't forget to take 1/2 tablet of amlodipine every evening.

## 2021-07-07 LAB — PAIN MGT SCRN (14 DRUGS), UR
Amphetamine Scrn, Ur: NEGATIVE ng/mL
BARBITURATE SCREEN URINE: NEGATIVE ng/mL
BENZODIAZEPINE SCREEN, URINE: NEGATIVE ng/mL
Buprenorphine, Urine: NEGATIVE ng/mL
CANNABINOIDS UR QL SCN: POSITIVE ng/mL — AB
Cocaine (Metab) Scrn, Ur: NEGATIVE ng/mL
Creatinine(Crt), U: 156.8 mg/dL (ref 20.0–300.0)
Fentanyl, Urine: NEGATIVE pg/mL
Meperidine Screen, Urine: NEGATIVE ng/mL
Methadone Screen, Urine: NEGATIVE ng/mL
OXYCODONE+OXYMORPHONE UR QL SCN: POSITIVE ng/mL — AB
Opiate Scrn, Ur: POSITIVE ng/mL — AB
Ph of Urine: 5.2 (ref 4.5–8.9)
Phencyclidine Qn, Ur: NEGATIVE ng/mL
Propoxyphene Scrn, Ur: NEGATIVE ng/mL
Tramadol Screen, Urine: NEGATIVE ng/mL

## 2021-08-11 ENCOUNTER — Other Ambulatory Visit: Payer: Self-pay | Admitting: Family Medicine

## 2021-08-11 DIAGNOSIS — M47816 Spondylosis without myelopathy or radiculopathy, lumbar region: Secondary | ICD-10-CM

## 2021-08-11 DIAGNOSIS — M533 Sacrococcygeal disorders, not elsewhere classified: Secondary | ICD-10-CM

## 2021-08-11 DIAGNOSIS — M48062 Spinal stenosis, lumbar region with neurogenic claudication: Secondary | ICD-10-CM

## 2021-08-11 NOTE — Telephone Encounter (Signed)
Medication Refill - Medication: oxyCODONE-acetaminophen (PERCOCET/ROXICET) 5-325 MG tablet  ? ?Has the patient contacted their pharmacy? Yes.   ?(Agent: If no, request that the patient contact the pharmacy for the refill. If patient does not wish to contact the pharmacy document the reason why and proceed with request.) ?(Agent: If yes, when and what did the pharmacy advise?) ? ?Preferred Pharmacy (with phone number or street name):  ?[id number] [place], [place] - [address]  ?[address] [place] [address]  ?Phone: [phone] Fax: [phone]  ? ?Has the patient been seen for an appointment in the last year OR does the patient have an upcoming appointment? Yes.   ? ?Agent: Please be advised that RX refills may take up to 3 business days. We ask that you follow-up with your pharmacy. ? ?

## 2021-08-12 NOTE — Telephone Encounter (Signed)
Requested medication (s) are due for refill today: yes ? ?Requested medication (s) are on the active medication list: yes ? ?Last refill:  07/01/21 ? ?Future visit scheduled: no ? ?Notes to clinic:  Unable to refill per protocol, cannot delegate.  ? ? ?  ?Requested Prescriptions  ?Pending Prescriptions Disp Refills  ? oxyCODONE-acetaminophen (PERCOCET/ROXICET) 5-325 MG tablet 160 tablet 0  ?  Sig: Take 1 tablet by mouth every 4 (four) hours as needed (back pain).  ?  ? Not Delegated - Analgesics:  Opioid Agonist Combinations Failed - 08/11/2021 12:31 PM  ?  ?  Failed - This refill cannot be delegated  ?  ?  Failed - Urine Drug Screen completed in last 360 days  ?  ?  Passed - Valid encounter within last 3 months  ?  Recent Outpatient Visits   ? ?      ? 1 month ago Essential (primary) hypertension  ? [facility] Malva Limes, MD  ? 5 months ago Essential (primary) hypertension  ? [facility] Malva Limes, MD  ? 10 months ago Hyperlipidemia, unspecified hyperlipidemia type  ? [facility] Sherrie Mustache, Demetrios Isaacs, MD  ? 1 year ago Essential (primary) hypertension  ? [facility] Sherrie Mustache, Demetrios Isaacs, MD  ? 1 year ago Essential (primary) hypertension  ? [facility] Sherrie Mustache, Demetrios Isaacs, MD  ? ?  ?  ? ?  ?  ?  ? ? ?

## 2021-08-13 MED ORDER — OXYCODONE-ACETAMINOPHEN 5-325 MG PO TABS: 1.0000 | ORAL_TABLET | ORAL | 0 refills | Status: DC | PRN

## 2021-09-01 ENCOUNTER — Other Ambulatory Visit: Payer: Self-pay | Admitting: Family Medicine

## 2021-09-01 DIAGNOSIS — I10 Essential (primary) hypertension: Secondary | ICD-10-CM

## 2021-09-09 ENCOUNTER — Other Ambulatory Visit: Payer: Self-pay | Admitting: Family Medicine

## 2021-09-09 DIAGNOSIS — M533 Sacrococcygeal disorders, not elsewhere classified: Secondary | ICD-10-CM

## 2021-09-09 DIAGNOSIS — M47816 Spondylosis without myelopathy or radiculopathy, lumbar region: Secondary | ICD-10-CM

## 2021-09-09 DIAGNOSIS — M48062 Spinal stenosis, lumbar region with neurogenic claudication: Secondary | ICD-10-CM

## 2021-09-09 NOTE — Telephone Encounter (Signed)
Requested medications are due for refill today.  yes ? ?Requested medications are on the active medications list.  yes ? ?Last refill. 08/13/2021 #160 0 refills ? ?Future visit scheduled.   no ? ?Notes to clinic.  Medication refill is not delegated. ? ? ? ?Requested Prescriptions  ?Pending Prescriptions Disp Refills  ? oxyCODONE-acetaminophen (PERCOCET/ROXICET) 5-325 MG tablet 160 tablet 0  ?  Sig: Take 1 tablet by mouth every 4 (four) hours as needed (back pain).  ?  ? Not Delegated - Analgesics:  Opioid Agonist Combinations Failed - 09/09/2021  3:59 PM  ?  ?  Failed - This refill cannot be delegated  ?  ?  Failed - Urine Drug Screen completed in last 360 days  ?  ?  Passed - Valid encounter within last 3 months  ?  Recent Outpatient Visits   ? ?      ? 2 months ago Essential (primary) hypertension  ? [facility] Malva Limes, MD  ? 6 months ago Essential (primary) hypertension  ? [facility] Malva Limes, MD  ? 11 months ago Hyperlipidemia, unspecified hyperlipidemia type  ? [facility] Sherrie Mustache, Demetrios Isaacs, MD  ? 1 year ago Essential (primary) hypertension  ? [facility] Sherrie Mustache, Demetrios Isaacs, MD  ? 1 year ago Essential (primary) hypertension  ? [facility] [address] Sherrie Mustache, Demetrios Isaacs, MD  ? ?  ?  ? ? ?  ?  ?  ?  ?

## 2021-09-09 NOTE — Telephone Encounter (Signed)
Medication Refill - Medication:  ?oxyCODONE-acetaminophen (PERCOCET/ROXICET) 5-325 MG tablet  ? ?Has the patient contacted their pharmacy? Yes.   ?Contact PCP ? ?Preferred Pharmacy (with phone number or street name):  ?[id number] [place], [facility] [address]  ?[address], [place] [address]  ?Phone:  [phone]  Fax:  [phone]  ? ?Has the patient been seen for an appointment in the last year OR does the patient have an upcoming appointment? Yes.   ? ?Agent: Please be advised that RX refills may take up to 3 business days. We ask that you follow-up with your pharmacy. ?

## 2021-09-10 MED ORDER — OXYCODONE-ACETAMINOPHEN 5-325 MG PO TABS: 1.0000 | ORAL_TABLET | ORAL | 0 refills | Status: DC | PRN

## 2021-10-13 ENCOUNTER — Other Ambulatory Visit: Payer: Self-pay | Admitting: Family Medicine

## 2021-10-13 DIAGNOSIS — M48062 Spinal stenosis, lumbar region with neurogenic claudication: Secondary | ICD-10-CM

## 2021-10-13 DIAGNOSIS — M533 Sacrococcygeal disorders, not elsewhere classified: Secondary | ICD-10-CM

## 2021-10-13 DIAGNOSIS — M47816 Spondylosis without myelopathy or radiculopathy, lumbar region: Secondary | ICD-10-CM

## 2021-10-13 NOTE — Telephone Encounter (Signed)
Medication Refill - Medication: oxyCODONE-acetaminophen (PERCOCET/ROXICET) 5-325 MG tablet  Has the patient contacted their pharmacy? No.  Preferred Pharmacy (with phone number or street name):  [id number] [place] [address] Phone:  [phone]  Fax:  [phone]      Has the patient been seen for an appointment in the last year OR does the patient have an upcoming appointment? Yes.    Agent: Please be advised that RX refills may take up to 3 business days. We ask that you follow-up with your pharmacy.  Encouraged pt to request refill through pharmacy in future

## 2021-10-13 NOTE — Telephone Encounter (Signed)
Requested medication (s) are due for refill today:yes  Requested medication (s) are on the active medication list: yes  Last refill:  09/10/21  Future visit scheduled: no  Notes to clinic: Unable to refill per protocol, cannot delegate.      Requested Prescriptions  Pending Prescriptions Disp Refills   oxyCODONE-acetaminophen (PERCOCET/ROXICET) 5-325 MG tablet 160 tablet 0    Sig: Take 1 tablet by mouth every 4 (four) hours as needed (back pain).     Not Delegated - Analgesics:  Opioid Agonist Combinations Failed - 10/13/2021  3:52 PM      Failed - This refill cannot be delegated      Failed - Urine Drug Screen completed in last 360 days      Failed - Valid encounter within last 3 months    Recent Outpatient Visits           3 months ago Essential (primary) hypertension   [facility] Birdie Sons, MD   7 months ago Essential (primary) hypertension   [facility] Birdie Sons, MD   1 year ago Hyperlipidemia, unspecified hyperlipidemia type   [facility] Birdie Sons, MD   1 year ago Essential (primary) hypertension   [facility] Birdie Sons, MD   1 year ago Essential (primary) hypertension   [facility] Fisher, Kirstie Peri, MD

## 2021-10-14 MED ORDER — OXYCODONE-ACETAMINOPHEN 5-325 MG PO TABS: 1.0000 | ORAL_TABLET | ORAL | 0 refills | Status: DC | PRN

## 2021-11-03 ENCOUNTER — Other Ambulatory Visit: Payer: Self-pay | Admitting: Family Medicine

## 2021-12-01 ENCOUNTER — Other Ambulatory Visit: Payer: Self-pay | Admitting: Family Medicine

## 2021-12-01 DIAGNOSIS — I10 Essential (primary) hypertension: Secondary | ICD-10-CM

## 2021-12-03 ENCOUNTER — Other Ambulatory Visit: Payer: Self-pay | Admitting: Family Medicine

## 2021-12-03 DIAGNOSIS — M533 Sacrococcygeal disorders, not elsewhere classified: Secondary | ICD-10-CM

## 2021-12-03 DIAGNOSIS — M47816 Spondylosis without myelopathy or radiculopathy, lumbar region: Secondary | ICD-10-CM

## 2021-12-03 DIAGNOSIS — M48062 Spinal stenosis, lumbar region with neurogenic claudication: Secondary | ICD-10-CM

## 2021-12-03 NOTE — Telephone Encounter (Unsigned)
Copied from CRM [id number]. Topic: General - Other >> Dec 03, 2021  3:04 PM Everette C wrote: Reason for CRM: Medication Refill - Medication: oxyCODONE-acetaminophen (PERCOCET/ROXICET) 5-325 MG tablet [[id number]]   Has the patient contacted their pharmacy? No. (Agent: If no, request that the patient contact the pharmacy for the refill. If patient does not wish to contact the pharmacy document the reason why and proceed with request.) (Agent: If yes, when and what did the pharmacy advise?)  Preferred Pharmacy (with phone number or street name): [phone] [place] [address] [place] [address] Phone: [phone] Fax: [phone] Hours: Not open 24 hours   Has the patient been seen for an appointment in the last year OR does the patient have an upcoming appointment? Yes.    Agent: Please be advised that RX refills may take up to 3 business days. We ask that you follow-up with your pharmacy.

## 2021-12-06 NOTE — Telephone Encounter (Signed)
Requested medications are due for refill today.  yes  Requested medications are on the active medications list.  yes  Last refill. 10/14/2021 #160 0 refills  Future visit scheduled.   no  Notes to clinic.  Refill not delegated. Pt not seen within the last 3 months.    Requested Prescriptions  Pending Prescriptions Disp Refills   oxyCODONE-acetaminophen (PERCOCET/ROXICET) 5-325 MG tablet 160 tablet 0    Sig: Take 1 tablet by mouth every 4 (four) hours as needed (back pain).     Not Delegated - Analgesics:  Opioid Agonist Combinations Failed - 12/03/2021  3:30 PM      Failed - This refill cannot be delegated      Failed - Urine Drug Screen completed in last 360 days      Failed - Valid encounter within last 3 months    Recent Outpatient Visits           5 months ago Essential (primary) hypertension   [facility] Malva Limes, MD   9 months ago Essential (primary) hypertension   [facility] Malva Limes, MD   1 year ago Hyperlipidemia, unspecified hyperlipidemia type   [facility] Malva Limes, MD   1 year ago Essential (primary) hypertension   [facility] Malva Limes, MD   1 year ago Essential (primary) hypertension   [facility], Demetrios Isaacs, MD

## 2021-12-08 MED ORDER — OXYCODONE-ACETAMINOPHEN 5-325 MG PO TABS: 1.0000 | ORAL_TABLET | ORAL | 0 refills | Status: DC | PRN

## 2021-12-26 ENCOUNTER — Other Ambulatory Visit: Payer: Self-pay | Admitting: Family Medicine

## 2021-12-26 DIAGNOSIS — I10 Essential (primary) hypertension: Secondary | ICD-10-CM

## 2022-01-07 ENCOUNTER — Other Ambulatory Visit: Payer: Self-pay | Admitting: Family Medicine

## 2022-01-07 DIAGNOSIS — M47816 Spondylosis without myelopathy or radiculopathy, lumbar region: Secondary | ICD-10-CM

## 2022-01-07 DIAGNOSIS — M48062 Spinal stenosis, lumbar region with neurogenic claudication: Secondary | ICD-10-CM

## 2022-01-07 DIAGNOSIS — M533 Sacrococcygeal disorders, not elsewhere classified: Secondary | ICD-10-CM

## 2022-01-07 NOTE — Telephone Encounter (Signed)
  Medication Refill - Medication: oxyCODONE-acetaminophen (PERCOCET/ROXICET) 5-325 MG tablet  Has the patient contacted their pharmacy? No.  Preferred Pharmacy (with phone number or street name): [id number] [place], [place] - [address] Has the patient been seen for an appointment in the last year OR does the patient have an upcoming appointment? Yes.    Agent: Please be advised that RX refills may take up to 3 business days. We ask that you follow-up with your pharmacy.

## 2022-01-09 ENCOUNTER — Other Ambulatory Visit: Payer: Self-pay | Admitting: Family Medicine

## 2022-01-09 DIAGNOSIS — I10 Essential (primary) hypertension: Secondary | ICD-10-CM

## 2022-01-11 MED ORDER — OXYCODONE-ACETAMINOPHEN 5-325 MG PO TABS: 1.0000 | ORAL_TABLET | ORAL | 0 refills | Status: DC | PRN

## 2022-01-11 NOTE — Progress Notes (Unsigned)
I,[name],acting as a scribe for [name], MD.,have documented all relevant documentation on the behalf of [name], MD,as directed by  [name], MD while in the presence of [name], MD.   Established patient visit   Patient: Alexander Mcbride   DOB: 04-22-1956   66 y.o. Male  MRN: [id number] Visit Date: 01/12/2022  Today's healthcare provider: [name], MD   Chief Complaint  Patient presents with   Hypertension   Hyperlipidemia   Subjective    HPI  Hypertension, follow-up  BP Readings from Last 3 Encounters:  01/12/22 (!) 202/80  07/06/21 (!) 149/81  03/09/21 (!) 166/64   Wt Readings from Last 3 Encounters:  01/12/22 197 lb (89.4 kg)  07/06/21 199 lb 4.8 oz (90.4 kg)  03/09/21 200 lb (90.7 kg)     He was last seen for hypertension 6  months  ago.  BP at that visit was 149/81. Management since that visit includes ordering labs. Patient did not complete labs.   He reports poor compliance with treatment. Has not been taking amlodipine; he states he forgets to take it. He is not having side effects.  He is following a Regular diet. He is not exercising, but reports walking a work on the job. He does not smoke.  Use of agents associated with hypertension: none.   Outside blood pressures are not checked regularly. He reports having an anxious feeling in his chest once every 2 weeks.  Symptoms: No chest pain No chest pressure  No palpitations No syncope  No dyspnea No orthopnea  No paroxysmal nocturnal dyspnea No lower extremity edema    ---------------------------------------------------------------------------------------------------   Follow up for chronic pain:  The patient was last seen for this 6 months ago. Urine drug screen was ordered during the last visit.  He reports good compliance with treatment. He feels that condition is Unchanged. He is not having side effects.    -----------------------------------------------------------------------------------------  Lipid/Cholesterol, Follow-up  Last lipid panel Other pertinent labs  Lab Results  Component Value Date   CHOL 199 09/18/2020   HDL 55 09/18/2020   LDLCALC 99 09/18/2020   LDLDIRECT 97 09/18/2020   TRIG 269 (H) 09/18/2020   CHOLHDL 3.6 09/18/2020   Lab Results  Component Value Date   ALT 27 09/18/2020   AST 29 09/18/2020   PLT 283 10/17/2013     He was last seen for this 6 months ago.  Management since that visit includes ordering labs. Patient did not complete lab work.  He reports good compliance with treatment. He is not having side effects.   Symptoms: No chest pain No chest pressure/discomfort  No dyspnea No lower extremity edema  No numbness or tingling of extremity No orthopnea  No palpitations No paroxysmal nocturnal dyspnea  No speech difficulty No syncope   Current diet: in general, an "unhealthy" diet Current exercise: none  The ASCVD Risk score (Arnett DK, et al., 2019) failed to calculate for the following reasons:   The valid systolic blood pressure range is 90 to 200 mmHg  ---------------------------------------------------------------------------------------------------   Medications: Outpatient Medications Prior to Visit  Medication Sig   co-enzyme Q-10 50 MG capsule Take 50 mg by mouth daily.   CREATINE PO Take 1 Dose by mouth daily.   hydrochlorothiazide (HYDRODIURIL) 12.5 MG tablet TAKE 1 TABLET BY MOUTH EVERY DAY   losartan (COZAAR) 100 MG tablet TAKE 1 TABLET BY MOUTH EVERY DAY   montelukast (SINGULAIR) 10 MG tablet TAKE 1  TABLET BY MOUTH EVERYDAY AT BEDTIME   Nutritional Supp - Diet Aids (FAT BURNER THERAPY PO) Take 1 Scoop by mouth daily.   oxyCODONE-acetaminophen (PERCOCET/ROXICET) 5-325 MG tablet Take 1 tablet by mouth every 4 (four) hours as needed (back pain).   rosuvastatin (CRESTOR) 10 MG tablet TAKE 1 TABLET BY MOUTH EVERY DAY   amLODipine  (NORVASC) 5 MG tablet Take 0.5 tablets (2.5 mg total) by mouth daily. (Patient not taking: Reported on 01/12/2022)   No facility-administered medications prior to visit.    Review of Systems  Constitutional:  Negative for appetite change, chills and fever.  Respiratory:  Negative for chest tightness, shortness of breath and wheezing.   Cardiovascular:  Negative for chest pain and palpitations.  Gastrointestinal:  Negative for abdominal pain, nausea and vomiting.       Objective    BP (!) 202/80 (BP Location: Right Arm, Patient Position: Sitting, Cuff Size: Large)   Pulse 63   Temp 97.8 F (36.6 C) (Oral)   Resp 16   Wt 197 lb (89.4 kg)   SpO2 100% Comment: room air  BMI 30.85 kg/m   Today's Vitals   01/12/22 1023 01/12/22 1033  BP: (!) 200/80 (!) 202/80  Pulse: 63   Resp: 16   Temp: 97.8 F (36.6 C)   TempSrc: Oral   SpO2: 100%   Weight: 197 lb (89.4 kg)    Body mass index is 30.85 kg/m.    Physical Exam    General: Appearance:    Overweight male in no acute distress  Eyes:    PERRL, conjunctiva/corneas clear, EOM's intact       Lungs:     Clear to auscultation bilaterally, respirations unlabored  Heart:    Normal heart rate. Normal rhythm. No murmurs, rubs, or gallops.    MS:   All extremities are intact.    Neurologic:   Awake, alert, oriented x 3. No apparent focal neurological defect.       EKG: No acute changes  Assessment & Plan     1. Essential (primary) hypertension Uncontrolled, not taking amlodipine which he was advised to restart. Taking losartan and hctz most but not all days.   Anticipated changing losartan to valsartan/hctz after reviewing labs.   2. Hyperlipidemia, unspecified hyperlipidemia type He is tolerating rosuvastatin well with no adverse effects.   - CBC - Comprehensive metabolic panel - Lipid panel  3. Prostate cancer screening  - PSA Total (Reflex To Free) ([facility] only)      The entirety of the information documented in  the History of Present Illness, Review of Systems and Physical Exam were personally obtained by me. Portions of this information were initially documented by the CMA and reviewed by me for thoroughness and accuracy.     [name], MD  [facility] [phone] (phone) [phone] (fax)  [facility]

## 2022-01-11 NOTE — Telephone Encounter (Signed)
Requested medication (s) are due for refill today: yes  Requested medication (s) are on the active medication list: yes  Last refill:  12/08/21 #160/0  Future visit scheduled: yes tomorrow  Notes to clinic:  Unable to refill per protocol, cannot delegate.    Requested Prescriptions  Pending Prescriptions Disp Refills   oxyCODONE-acetaminophen (PERCOCET/ROXICET) 5-325 MG tablet 160 tablet 0    Sig: Take 1 tablet by mouth every 4 (four) hours as needed (back pain).     Not Delegated - Analgesics:  Opioid Agonist Combinations Failed - 01/07/2022  3:41 PM      Failed - This refill cannot be delegated      Failed - Urine Drug Screen completed in last 360 days      Failed - Valid encounter within last 3 months    Recent Outpatient Visits           6 months ago Essential (primary) hypertension   [facility] Malva Limes, MD   10 months ago Essential (primary) hypertension   [facility] Malva Limes, MD   1 year ago Hyperlipidemia, unspecified hyperlipidemia type   [facility] Malva Limes, MD   1 year ago Essential (primary) hypertension   [facility] Malva Limes, MD   1 year ago Essential (primary) hypertension   [facility], Demetrios Isaacs, MD       Future Appointments             Tomorrow Sherrie Mustache, Demetrios Isaacs, MD [facility]

## 2022-01-12 ENCOUNTER — Ambulatory Visit: Payer: BC Managed Care – PPO | Admitting: Family Medicine

## 2022-01-12 ENCOUNTER — Encounter: Payer: Self-pay | Admitting: Family Medicine

## 2022-01-12 VITALS — BP 202/80 | HR 63 | Temp 97.8°F | Resp 16 | Wt 197.0 lb

## 2022-01-12 DIAGNOSIS — Z125 Encounter for screening for malignant neoplasm of prostate: Secondary | ICD-10-CM | POA: Diagnosis not present

## 2022-01-12 DIAGNOSIS — E785 Hyperlipidemia, unspecified: Secondary | ICD-10-CM | POA: Diagnosis not present

## 2022-01-12 DIAGNOSIS — I10 Essential (primary) hypertension: Secondary | ICD-10-CM | POA: Diagnosis not present

## 2022-01-12 MED ORDER — AMLODIPINE BESYLATE 5 MG PO TABS: 2.5000 mg | ORAL_TABLET | Freq: Every morning | ORAL | Status: DC

## 2022-01-12 NOTE — Patient Instructions (Addendum)
Please review the attached list of medications and notify my office if there are any errors.   Please bring all of your medications to every appointment so we can make sure that our medication list is the same as yours.   If your labs are ok, then will be changing the losartan to valsartan, which is a more effective blood pressure medication  You can take all of your blood pressure medications together in the morning.

## 2022-01-20 DIAGNOSIS — E785 Hyperlipidemia, unspecified: Secondary | ICD-10-CM | POA: Diagnosis not present

## 2022-01-20 DIAGNOSIS — Z125 Encounter for screening for malignant neoplasm of prostate: Secondary | ICD-10-CM | POA: Diagnosis not present

## 2022-01-21 LAB — CBC
Hematocrit: 44.6 % (ref 37.5–51.0)
Hemoglobin: 14.5 g/dL (ref 13.0–17.7)
MCH: 28.5 pg (ref 26.6–33.0)
MCHC: 32.5 g/dL (ref 31.5–35.7)
MCV: 88 fL (ref 79–97)
Platelets: 260 10*3/uL (ref 150–450)
RBC: 5.09 x10E6/uL (ref 4.14–5.80)
RDW: 12.8 % (ref 11.6–15.4)
WBC: 6.1 10*3/uL (ref 3.4–10.8)

## 2022-01-21 LAB — COMPREHENSIVE METABOLIC PANEL
ALT: 13 IU/L (ref 0–44)
AST: 17 IU/L (ref 0–40)
Albumin/Globulin Ratio: 1.6 (ref 1.2–2.2)
Albumin: 3.3 g/dL — ABNORMAL LOW (ref 3.9–4.9)
Alkaline Phosphatase: 80 IU/L (ref 44–121)
BUN/Creatinine Ratio: 23 (ref 10–24)
BUN: 27 mg/dL (ref 8–27)
Bilirubin Total: 0.9 mg/dL (ref 0.0–1.2)
CO2: 23 mmol/L (ref 20–29)
Calcium: 8.5 mg/dL — ABNORMAL LOW (ref 8.6–10.2)
Chloride: 105 mmol/L (ref 96–106)
Creatinine, Ser: 1.19 mg/dL (ref 0.76–1.27)
Globulin, Total: 2.1 g/dL (ref 1.5–4.5)
Glucose: 116 mg/dL — ABNORMAL HIGH (ref 70–99)
Potassium: 4.8 mmol/L (ref 3.5–5.2)
Sodium: 140 mmol/L (ref 134–144)
Total Protein: 5.4 g/dL — ABNORMAL LOW (ref 6.0–8.5)
eGFR: 68 mL/min/{1.73_m2} (ref >59)

## 2022-01-21 LAB — LIPID PANEL
Chol/HDL Ratio: 4.3 ratio (ref 0.0–5.0)
Cholesterol, Total: 226 mg/dL — ABNORMAL HIGH (ref 100–199)
HDL: 53 mg/dL (ref >39)
LDL Chol Calc (NIH): 143 mg/dL — ABNORMAL HIGH (ref 0–99)
Triglycerides: 170 mg/dL — ABNORMAL HIGH (ref 0–149)
VLDL Cholesterol Cal: 30 mg/dL (ref 5–40)

## 2022-01-21 LAB — PSA TOTAL (REFLEX TO FREE): Prostate Specific Ag, Serum: 0.8 ng/mL (ref 0.0–4.0)

## 2022-01-21 MED ORDER — ROSUVASTATIN CALCIUM 20 MG PO TABS: 20.0000 mg | ORAL_TABLET | Freq: Every day | ORAL | 0 refills | Status: DC

## 2022-01-21 MED ORDER — VALSARTAN 160 MG PO TABS: 160.0000 mg | ORAL_TABLET | Freq: Every day | ORAL | 0 refills | Status: DC

## 2022-01-21 NOTE — Addendum Note (Signed)
Addended by: Marlana Salvage on: 01/21/2022 10:53 AM   Modules accepted: Orders

## 2022-02-03 ENCOUNTER — Other Ambulatory Visit: Payer: Self-pay | Admitting: Family Medicine

## 2022-02-03 DIAGNOSIS — I10 Essential (primary) hypertension: Secondary | ICD-10-CM

## 2022-02-14 ENCOUNTER — Encounter: Payer: Self-pay | Admitting: Family Medicine

## 2022-02-14 ENCOUNTER — Ambulatory Visit: Payer: BC Managed Care – PPO | Admitting: Family Medicine

## 2022-02-14 VITALS — BP 178/72 | HR 73 | Temp 97.8°F | Resp 14 | Wt 196.0 lb

## 2022-02-14 DIAGNOSIS — M47816 Spondylosis without myelopathy or radiculopathy, lumbar region: Secondary | ICD-10-CM

## 2022-02-14 DIAGNOSIS — I10 Essential (primary) hypertension: Secondary | ICD-10-CM | POA: Diagnosis not present

## 2022-02-14 DIAGNOSIS — M533 Sacrococcygeal disorders, not elsewhere classified: Secondary | ICD-10-CM

## 2022-02-14 DIAGNOSIS — M48062 Spinal stenosis, lumbar region with neurogenic claudication: Secondary | ICD-10-CM

## 2022-02-14 DIAGNOSIS — Z23 Encounter for immunization: Secondary | ICD-10-CM

## 2022-02-14 DIAGNOSIS — R079 Chest pain, unspecified: Secondary | ICD-10-CM | POA: Diagnosis not present

## 2022-02-14 MED ORDER — OXYCODONE-ACETAMINOPHEN 5-325 MG PO TABS: 1.0000 | ORAL_TABLET | ORAL | 0 refills | Status: DC | PRN

## 2022-02-14 MED ORDER — AMLODIPINE BESYLATE 5 MG PO TABS: 10.0000 mg | ORAL_TABLET | Freq: Every day | ORAL | Status: DC

## 2022-02-14 NOTE — Patient Instructions (Addendum)
Please review the attached list of medications and notify my office if there are any errors.   Increase the amlodipine to 2 tablets once every day

## 2022-02-14 NOTE — Progress Notes (Signed)
I,[name],acting as a scribe for Lelon Huh, MD.,have documented all relevant documentation on the behalf of Lelon Huh, MD,as directed by  Lelon Huh, MD while in the presence of Lelon Huh, MD.    Established patient visit   Patient: Alexander Mcbride   DOB: Aug 23, 1955   66 y.o. Male  MRN: [id number] Visit Date: 02/14/2022  Today's healthcare provider: Lelon Huh, MD   Chief Complaint  Patient presents with   Hypertension   Subjective    HPI  Hypertension, follow-up  BP Readings from Last 3 Encounters:  02/14/22 (!) 178/72  01/12/22 (!) 202/80  07/06/21 (!) 149/81   Wt Readings from Last 3 Encounters:  02/14/22 196 lb (88.9 kg)  01/12/22 197 lb (89.4 kg)  07/06/21 199 lb 4.8 oz (90.4 kg)     He was last seen for hypertension 4 weeks ago.  BP at that visit was 202/80. Management since that visit includes changing losartan to valsartan for better blood pressure control.  He reports good compliance with treatment. He is having side effects (urinary frequency).  He is following a Regular diet. He is exercising. He does not smoke.  Use of agents associated with hypertension: none.   Outside blood pressures are not checked. Symptoms: No chest pain No chest pressure  No palpitations No syncope  No dyspnea No orthopnea  No paroxysmal nocturnal dyspnea No lower extremity edema   Pertinent labs Lab Results  Component Value Date   CHOL 226 (H) 01/20/2022   HDL 53 01/20/2022   LDLCALC 143 (H) 01/20/2022   LDLDIRECT 97 09/18/2020   TRIG 170 (H) 01/20/2022   CHOLHDL 4.3 01/20/2022   Lab Results  Component Value Date   NA 140 01/20/2022   K 4.8 01/20/2022   CREATININE 1.19 01/20/2022   EGFR 68 01/20/2022   GLUCOSE 116 (H) 01/20/2022     The 10-year ASCVD risk score (Arnett DK, et al., 2019) is: 24.6%  He also reports several of left sided upper chest and arm pain over the last few weeks. Not clearly exertional. A little  tingling in arm. No dyspnea. No known musculoskeletal injuries.  ---------------------------------------------------------------------------------------------------   Medications: Outpatient Medications Prior to Visit  Medication Sig   amLODipine (NORVASC) 5 MG tablet TAKE 1 TABLET BY MOUTH EVERY DAY IN THE EVENING   co-enzyme Q-10 50 MG capsule Take 50 mg by mouth daily.   CREATINE PO Take 1 Dose by mouth daily.   hydrochlorothiazide (HYDRODIURIL) 12.5 MG tablet TAKE 1 TABLET BY MOUTH EVERY DAY   montelukast (SINGULAIR) 10 MG tablet TAKE 1 TABLET BY MOUTH EVERYDAY AT BEDTIME   Nutritional Supp - Diet Aids (FAT BURNER THERAPY PO) Take 1 Scoop by mouth daily.   oxyCODONE-acetaminophen (PERCOCET/ROXICET) 5-325 MG tablet Take 1 tablet by mouth every 4 (four) hours as needed (back pain).   rosuvastatin (CRESTOR) 20 MG tablet Take 1 tablet (20 mg total) by mouth daily.   valsartan (DIOVAN) 160 MG tablet Take 1 tablet (160 mg total) by mouth daily.   [DISCONTINUED] losartan (COZAAR) 100 MG tablet TAKE 1 TABLET BY MOUTH EVERY DAY (Patient not taking: Reported on 02/14/2022)   No facility-administered medications prior to visit.    Review of Systems  Constitutional:  Negative for appetite change, chills and fever.  Respiratory:  Negative for chest tightness, shortness of breath and wheezing.   Cardiovascular:  Negative for chest pain and palpitations.  Gastrointestinal:  Negative for abdominal pain, nausea and vomiting.  Endocrine:  Positive for polyuria.       Objective    BP (!) 178/72 (BP Location: Right Arm, Patient Position: Sitting, Cuff Size: Large)   Pulse 73   Temp 97.8 F (36.6 C) (Oral)   Resp 14   Wt 196 lb (88.9 kg)   SpO2 99% Comment: room air  BMI 30.70 kg/m    Today's Vitals   02/14/22 1626 02/14/22 1630  BP: (!) 170/74 (!) 178/72  Pulse: 73   Resp: 14   Temp: 97.8 F (36.6 C)   TempSrc: Oral   SpO2: 99%   Weight: 196 lb (88.9 kg)    Body mass index is 30.7  kg/m.   Physical Exam   General: Appearance:    Obese male in no acute distress  Eyes:    PERRL, conjunctiva/corneas clear, EOM's intact       Lungs:     Clear to auscultation bilaterally, respirations unlabored  Heart:    Normal heart rate. Normal rhythm. No murmurs, rubs, or gallops.    MS:   All extremities are intact.    Neurologic:   Awake, alert, oriented x 3. No apparent focal neurological defect.         Assessment & Plan     1. Essential (primary) hypertension Is doing better about taking medication consistently with some improvement since change from losartan to valsartan. Will double amLODipine (NORVASC) 5 MG tablet to Take 2 tablets (10 mg total) by mouth daily.  2. Chest pain, unspecified type Not classic angina, but he does have multiple cardiac risk factors including extensive family history of heart disease.  - Ambulatory referral to Cardiology  3. Spinal stenosis, lumbar region, with neurogenic claudication  4. Sacroiliac joint dysfunction  5. Facet syndrome, lumbar refill - oxyCODONE-acetaminophen (PERCOCET/ROXICET) 5-325 MG tablet; Take 1 tablet by mouth every 4 (four) hours as needed (back pain).  Dispense: 160 tablet; Refill: 0   Future Appointments  Date Time Provider [place]  03/18/2022  3:40 PM Caryn Section, Kirstie Peri, MD [facility]        The entirety of the information documented in the History of Present Illness, Review of Systems and Physical Exam were personally obtained by me. Portions of this information were initially documented by the CMA and reviewed by me for thoroughness and accuracy.     Lelon Huh, MD  [facility] [phone] (phone) [phone] (fax)  [place]

## 2022-03-16 IMAGING — CT CT T SPINE W/O CM
3 of 5 series · 9 of 33 positions shown, 10 images · non-contrast
Comparison: None.

CLINICAL DATA: Midthoracic back pain.  Right-sided pain.

EXAM:
CT THORACIC SPINE WITHOUT CONTRAST
TECHNIQUE: Multidetector CT images of the thoracic were obtained using the
standard protocol without intravenous contrast.

[Series 2: t-spine st t-spine 2.00 · axial · 0.32mm/px · z∈[-1066,-1066]mm · 1 of 189 slices shown, 2 images]
[im 95/189  soft-tissue]
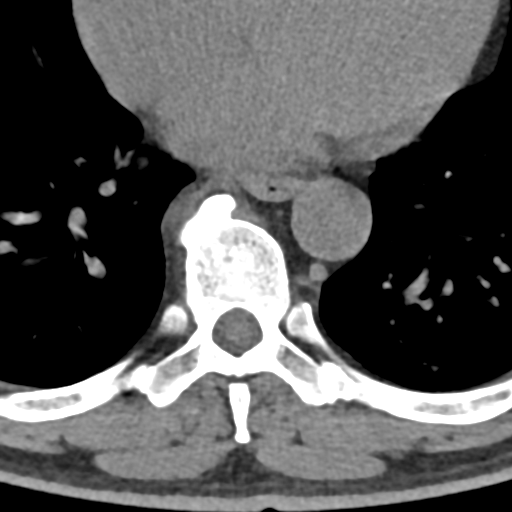
[im 95/189  bone]
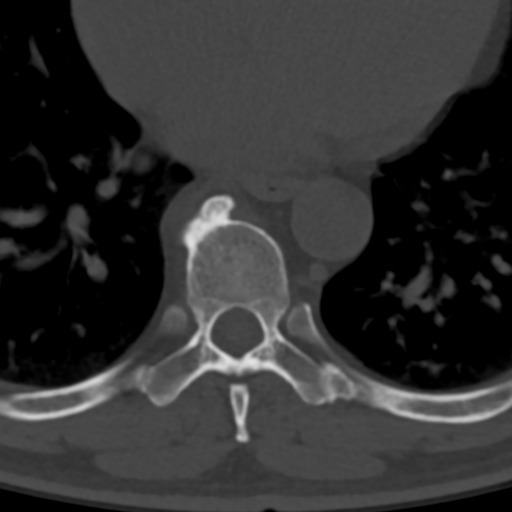

[Series 4: sag bone t-spine 2.00 sag · sagittal · 0.32mm/px · 5 of 82 slices shown]
[im 28/82  bone]
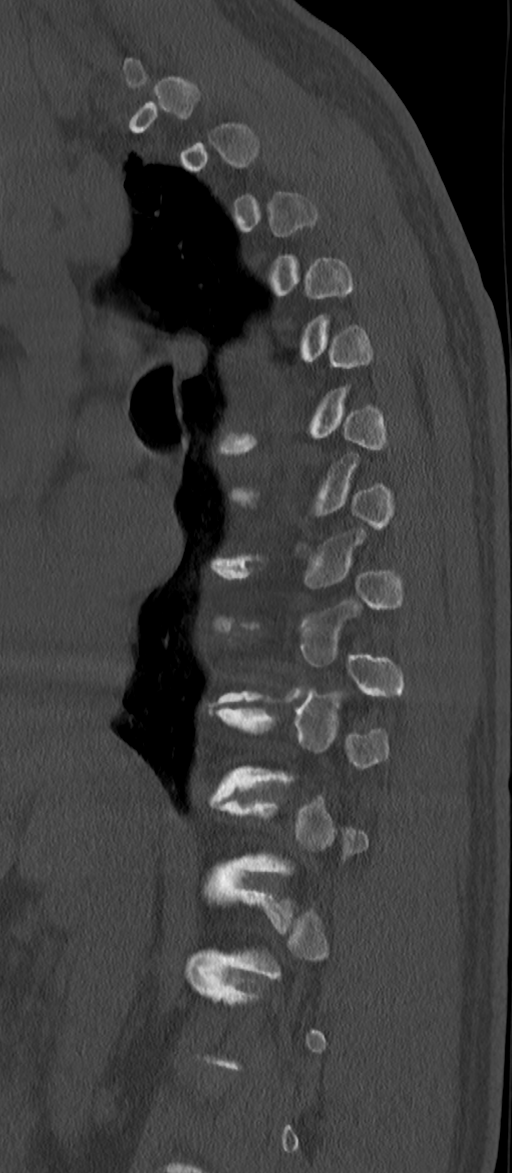
[im 34/82  bone]
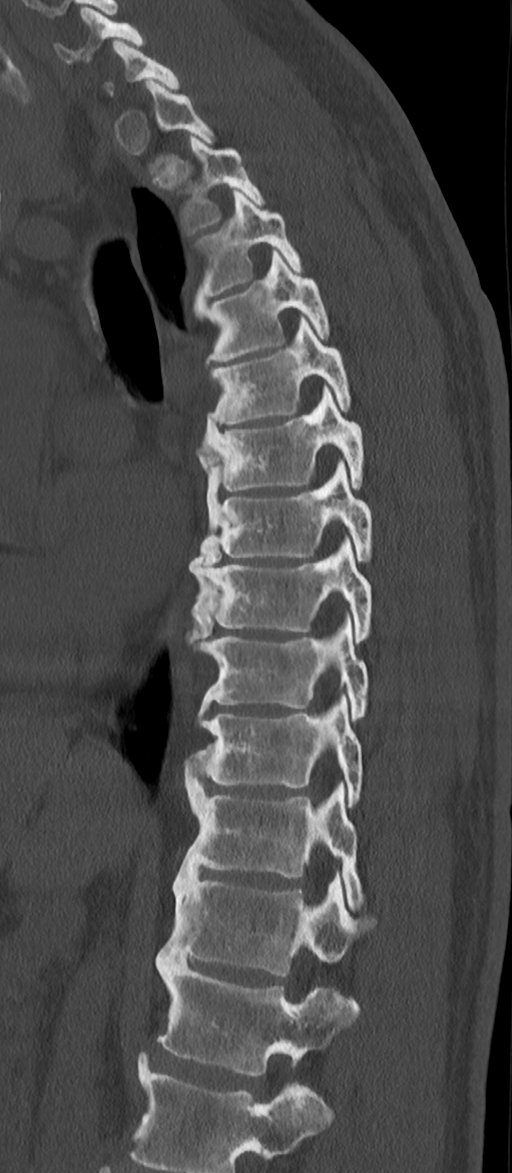
[im 41/82  bone]
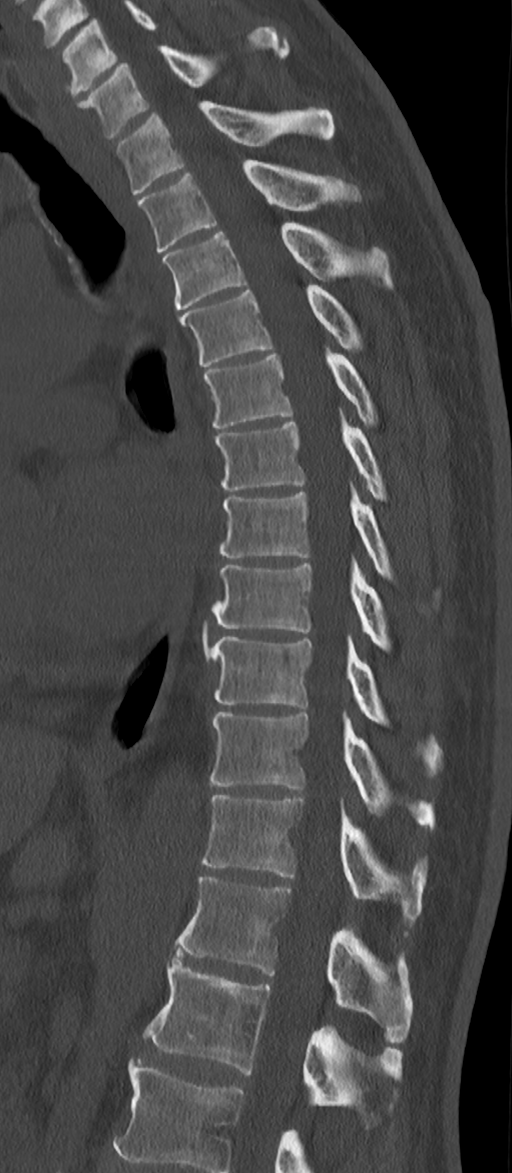
[im 48/82  bone]
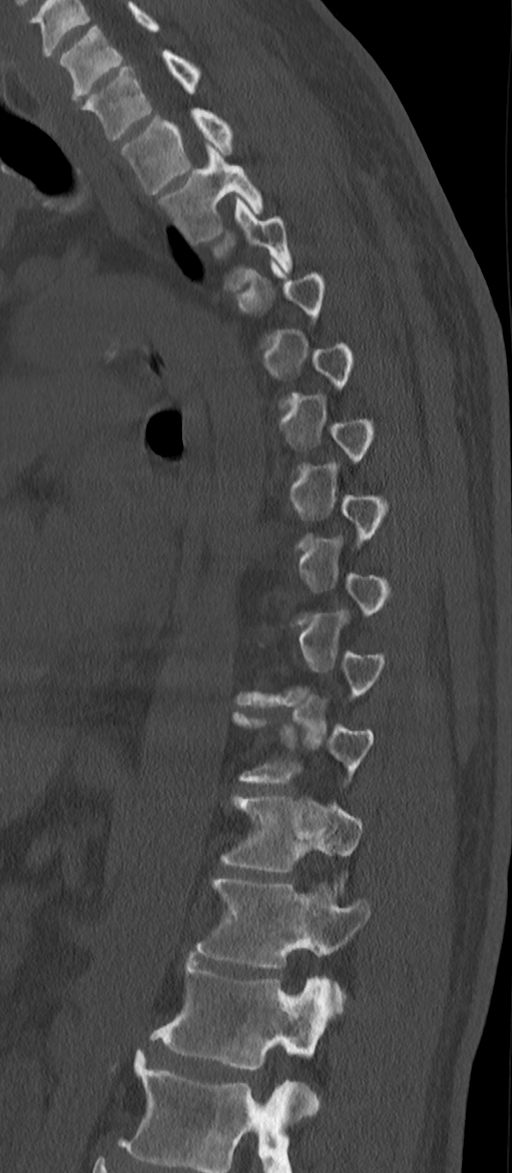
[im 55/82  bone]
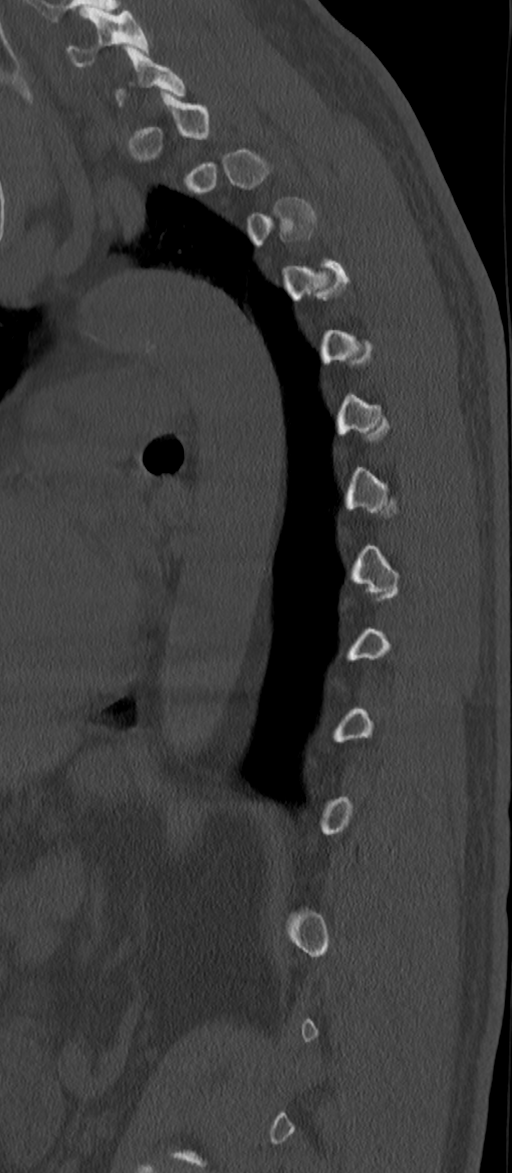

[Series 6: cor bone t-spine 2.00 cor · coronal · 0.32mm/px · 3 of 82 slices shown]
[im 17/82  bone]
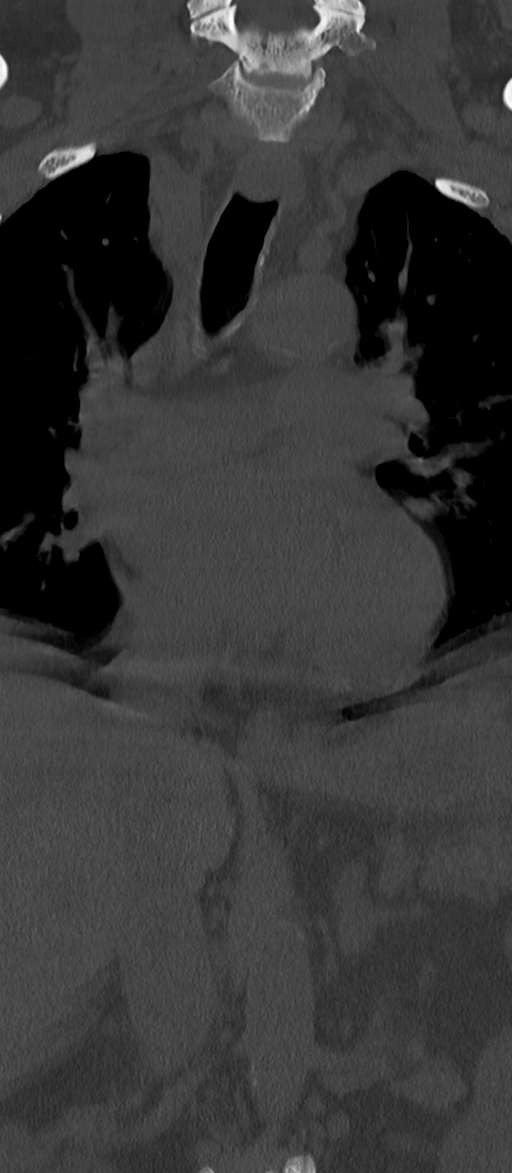
[im 33/82  bone]
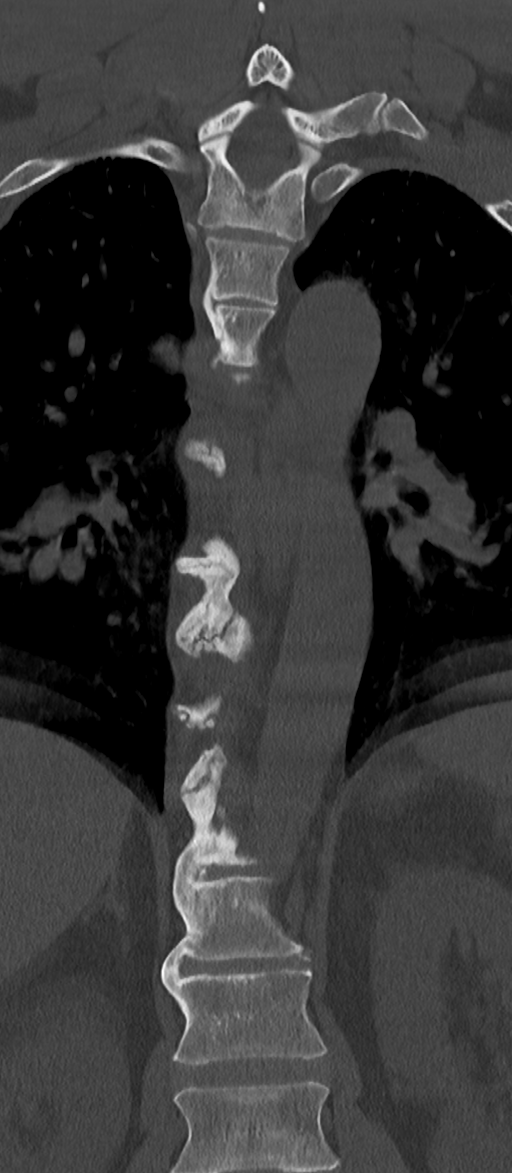
[im 49/82  bone]
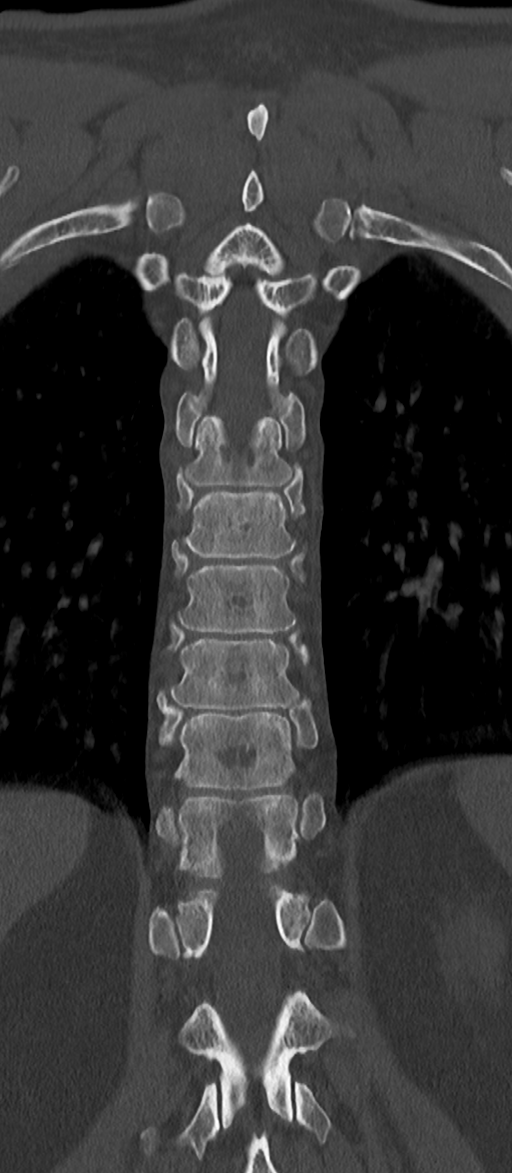

[9 of 33 positions shown; findings below may reference images not displayed]

FINDINGS: Alignment: Normal

Vertebrae: Negative for fracture or mass lesion.

Paraspinal and other soft tissues: No paraspinous soft tissue mass.

Visualized lungs are clear.  No effusion.

Disc levels: Disc spaces are maintained throughout the thoracic
spine. There is mild anterior and right lateral spurring throughout
the thoracic spine related to mild disc degeneration.

No focal disc protrusion or spinal stenosis identified.
IMPRESSION: Mild thoracic disc degeneration and anterior spurring at multiple
levels on the right.

No significant disc space narrowing or spinal stenosis

If symptoms persist, consider MRI which is more accurate for
detection of disc protrusion and neural impingement.

## 2022-03-18 ENCOUNTER — Ambulatory Visit: Payer: BC Managed Care – PPO | Admitting: Family Medicine

## 2022-03-23 ENCOUNTER — Other Ambulatory Visit: Payer: Self-pay | Admitting: Family Medicine

## 2022-03-23 DIAGNOSIS — M533 Sacrococcygeal disorders, not elsewhere classified: Secondary | ICD-10-CM

## 2022-03-23 DIAGNOSIS — M48062 Spinal stenosis, lumbar region with neurogenic claudication: Secondary | ICD-10-CM

## 2022-03-23 DIAGNOSIS — M47816 Spondylosis without myelopathy or radiculopathy, lumbar region: Secondary | ICD-10-CM

## 2022-03-23 NOTE — Telephone Encounter (Signed)
Medication Refill - Medication: oxyCODONE-acetaminophen (PERCOCET/ROXICET) 5-325 MG tablet   Has the patient contacted their pharmacy? No.  Preferred Pharmacy (with phone number or street name):  [id number] [place],  - [address] Phone: [phone]  Fax: [phone]     Has the patient been seen for an appointment in the last year OR does the patient have an upcoming appointment? Yes.    Agent: Please be advised that RX refills may take up to 3 business days. We ask that you follow-up with your pharmacy.  

## 2022-03-24 MED ORDER — OXYCODONE-ACETAMINOPHEN 5-325 MG PO TABS: 1.0000 | ORAL_TABLET | ORAL | 0 refills | Status: DC | PRN

## 2022-03-24 NOTE — Telephone Encounter (Signed)
Requested medications are due for refill today.  unsure  Requested medications are on the active medications list.  yes  Last refill. 02/14/2022 #160 0 rf  Future visit scheduled.   yes  Notes to clinic.  Refill not delegated.    Requested Prescriptions  Pending Prescriptions Disp Refills   oxyCODONE-acetaminophen (PERCOCET/ROXICET) 5-325 MG tablet 160 tablet 0    Sig: Take 1 tablet by mouth every 4 (four) hours as needed (back pain).     Not Delegated - Analgesics:  Opioid Agonist Combinations Failed - 03/23/2022  4:05 PM      Failed - This refill cannot be delegated      Failed - Urine Drug Screen completed in last 360 days      Passed - Valid encounter within last 3 months    Recent Outpatient Visits           1 month ago Essential (primary) hypertension   [facility] Malva Limes, MD   2 months ago Essential (primary) hypertension   [facility] Malva Limes, MD   8 months ago Essential (primary) hypertension   [facility] Malva Limes, MD   1 year ago Essential (primary) hypertension   [facility] Malva Limes, MD   1 year ago Hyperlipidemia, unspecified hyperlipidemia type   [facility] Malva Limes, MD       Future Appointments             In 3 weeks Fisher, Demetrios Isaacs, MD [facility], [facility]

## 2022-04-12 ENCOUNTER — Other Ambulatory Visit: Payer: Self-pay | Admitting: Family Medicine

## 2022-04-12 DIAGNOSIS — E785 Hyperlipidemia, unspecified: Secondary | ICD-10-CM

## 2022-04-12 NOTE — Telephone Encounter (Signed)
Requested Prescriptions  Pending Prescriptions Disp Refills   rosuvastatin (CRESTOR) 20 MG tablet [Pharmacy Med Name: ROSUVASTATIN CALCIUM 20 MG TAB] 90 tablet 0    Sig: TAKE 1 TABLET BY MOUTH EVERY DAY     Cardiovascular:  Antilipid - Statins 2 Failed - 04/12/2022 10:50 AM      Failed - Lipid Panel in normal range within the last 12 months    Cholesterol, Total  Date Value Ref Range Status  01/20/2022 226 (H) 100 - 199 mg/dL Final   LDL Chol Calc (NIH)  Date Value Ref Range Status  01/20/2022 143 (H) 0 - 99 mg/dL Final   LDL Direct  Date Value Ref Range Status  09/18/2020 97 0 - 99 mg/dL Final   HDL  Date Value Ref Range Status  01/20/2022 53 >39 mg/dL Final   Triglycerides  Date Value Ref Range Status  01/20/2022 170 (H) 0 - 149 mg/dL Final         Passed - Cr in normal range and within 360 days    Creatinine, Ser  Date Value Ref Range Status  01/20/2022 1.19 0.76 - 1.27 mg/dL Final         Passed - Patient is not pregnant      Passed - Valid encounter within last 12 months    Recent Outpatient Visits           1 month ago Essential (primary) hypertension   [facility] Malva Limes, MD   3 months ago Essential (primary) hypertension   [facility], Demetrios Isaacs, MD   9 months ago Essential (primary) hypertension   [facility], Demetrios Isaacs, MD   1 year ago Essential (primary) hypertension   [facility] Malva Limes, MD   1 year ago Hyperlipidemia, unspecified hyperlipidemia type   [facility] Fisher, Demetrios Isaacs, MD       Future Appointments             In 3 days Fisher, Demetrios Isaacs, MD [facility], [facility]

## 2022-04-14 NOTE — Progress Notes (Deleted)
      Established patient visit   Patient: Alexander Mcbride   DOB: January 31, 1956   66 y.o. Male  MRN: [id number] Visit Date: 04/15/2022  Today's healthcare provider: Lelon Huh, MD   No chief complaint on file.  Subjective    HPI  Hypertension, follow-up  BP Readings from Last 3 Encounters:  02/14/22 (!) 178/72  01/12/22 (!) 202/80  07/06/21 (!) 149/81   Wt Readings from Last 3 Encounters:  02/14/22 196 lb (88.9 kg)  01/12/22 197 lb (89.4 kg)  07/06/21 199 lb 4.8 oz (90.4 kg)     He was last seen for hypertension 2 months ago.  Management since that visit includes; doubled amlodipine (NORVASC) 5 MG tablet to Take 2 tablets (10 mg total) by mouth daily. .  Outside blood pressures are {***enter patient reported home BP readings, or 'not being checked':1}.  Pertinent labs Lab Results  Component Value Date   CHOL 226 (H) 01/20/2022   HDL 53 01/20/2022   LDLCALC 143 (H) 01/20/2022   LDLDIRECT 97 09/18/2020   TRIG 170 (H) 01/20/2022   CHOLHDL 4.3 01/20/2022   Lab Results  Component Value Date   NA 140 01/20/2022   K 4.8 01/20/2022   CREATININE 1.19 01/20/2022   EGFR 68 01/20/2022   GLUCOSE 116 (H) 01/20/2022     The 10-year ASCVD risk score (Arnett DK, et al., 2019) is: 24.6%  ---------------------------------------------------------------------------------------------------   Medications: Outpatient Medications Prior to Visit  Medication Sig   amLODipine (NORVASC) 5 MG tablet Take 2 tablets (10 mg total) by mouth daily.   co-enzyme Q-10 50 MG capsule Take 50 mg by mouth daily.   CREATINE PO Take 1 Dose by mouth daily.   hydrochlorothiazide (HYDRODIURIL) 12.5 MG tablet TAKE 1 TABLET BY MOUTH EVERY DAY   montelukast (SINGULAIR) 10 MG tablet TAKE 1 TABLET BY MOUTH EVERYDAY AT BEDTIME   Nutritional Supp - Diet Aids (FAT BURNER THERAPY PO) Take 1 Scoop by mouth daily.   oxyCODONE-acetaminophen (PERCOCET/ROXICET) 5-325 MG tablet Take 1 tablet by mouth every  4 (four) hours as needed (back pain).   rosuvastatin (CRESTOR) 20 MG tablet TAKE 1 TABLET BY MOUTH EVERY DAY   valsartan (DIOVAN) 160 MG tablet Take 1 tablet (160 mg total) by mouth daily.   No facility-administered medications prior to visit.    Review of Systems  Constitutional:  Negative for appetite change, chills and fever.  Respiratory:  Negative for chest tightness, shortness of breath and wheezing.   Cardiovascular:  Negative for chest pain and palpitations.  Gastrointestinal:  Negative for abdominal pain, nausea and vomiting.    {Labs  Heme  Chem  Endocrine  Serology  Results Review (optional):23779}   Objective    There were no vitals taken for this visit. {Show previous vital signs (optional):23777}  Physical Exam  ***  No results found for any visits on 04/15/22.  Assessment & Plan     ***  No follow-ups on file.      {provider attestation***:1}   Lelon Huh, MD  [facility] [phone] (phone) [phone] (fax)  [place]

## 2022-04-15 ENCOUNTER — Ambulatory Visit: Payer: BC Managed Care – PPO | Admitting: Family Medicine

## 2022-04-21 ENCOUNTER — Other Ambulatory Visit: Payer: Self-pay | Admitting: Family Medicine

## 2022-04-21 DIAGNOSIS — I10 Essential (primary) hypertension: Secondary | ICD-10-CM

## 2022-04-21 NOTE — Telephone Encounter (Signed)
Requested Prescriptions  Pending Prescriptions Disp Refills   valsartan (DIOVAN) 160 MG tablet [Pharmacy Med Name: VALSARTAN 160 MG TABLET] 90 tablet 1    Sig: TAKE 1 TABLET BY MOUTH EVERY DAY     Cardiovascular:  Angiotensin Receptor Blockers Failed - 04/21/2022 12:38 AM      Failed - Last BP in normal range    BP Readings from Last 1 Encounters:  02/14/22 (!) 178/72         Passed - Cr in normal range and within 180 days    Creatinine, Ser  Date Value Ref Range Status  01/20/2022 1.19 0.76 - 1.27 mg/dL Final         Passed - K in normal range and within 180 days    Potassium  Date Value Ref Range Status  01/20/2022 4.8 3.5 - 5.2 mmol/L Final         Passed - Patient is not pregnant      Passed - Valid encounter within last 6 months    Recent Outpatient Visits           2 months ago Essential (primary) hypertension   [facility] Malva Limes, MD   3 months ago Essential (primary) hypertension   [facility], Demetrios Isaacs, MD   9 months ago Essential (primary) hypertension   [facility], Demetrios Isaacs, MD   1 year ago Essential (primary) hypertension   [facility] Malva Limes, MD   1 year ago Hyperlipidemia, unspecified hyperlipidemia type   [facility] Sherrie Mustache, Demetrios Isaacs, MD

## 2022-05-05 ENCOUNTER — Other Ambulatory Visit: Payer: Self-pay | Admitting: Family Medicine

## 2022-05-05 DIAGNOSIS — M48062 Spinal stenosis, lumbar region with neurogenic claudication: Secondary | ICD-10-CM

## 2022-05-05 DIAGNOSIS — M533 Sacrococcygeal disorders, not elsewhere classified: Secondary | ICD-10-CM

## 2022-05-05 DIAGNOSIS — M47816 Spondylosis without myelopathy or radiculopathy, lumbar region: Secondary | ICD-10-CM

## 2022-05-05 NOTE — Telephone Encounter (Signed)
Medication Refill - Medication: oxyCODONE-acetaminophen (PERCOCET/ROXICET) 5-325 MG tablet   Has the patient contacted their pharmacy? No.  Preferred Pharmacy (with phone number or street name):  [id number] [place] [address] Phone: [phone]  Fax: [phone]     Has the patient been seen for an appointment in the last year OR does the patient have an upcoming appointment? Yes.    Agent: Please be advised that RX refills may take up to 3 business days. We ask that you follow-up with your pharmacy.

## 2022-05-06 NOTE — Telephone Encounter (Signed)
Requested medication (s) are due for refill today: Yes  Requested medication (s) are on the active medication list: Yes  Last refill:  03/24/22  Future visit scheduled: Yes  Notes to clinic:  Unable to refill per protocol, cannot delegate.      Requested Prescriptions  Pending Prescriptions Disp Refills   oxyCODONE-acetaminophen (PERCOCET/ROXICET) 5-325 MG tablet 160 tablet 0    Sig: Take 1 tablet by mouth every 4 (four) hours as needed (back pain).     Not Delegated - Analgesics:  Opioid Agonist Combinations Failed - 05/05/2022  1:01 PM      Failed - This refill cannot be delegated      Failed - Urine Drug Screen completed in last 360 days      Passed - Valid encounter within last 3 months    Recent Outpatient Visits           2 months ago Essential (primary) hypertension   [facility] Malva Limes, MD   3 months ago Essential (primary) hypertension   [facility] Fisher, Demetrios Isaacs, MD   10 months ago Essential (primary) hypertension   [facility], Demetrios Isaacs, MD   1 year ago Essential (primary) hypertension   [facility] Malva Limes, MD   1 year ago Hyperlipidemia, unspecified hyperlipidemia type   [facility] Malva Limes, MD       Future Appointments             In 4 weeks Fisher, Demetrios Isaacs, MD [facility], [facility]

## 2022-05-07 MED ORDER — OXYCODONE-ACETAMINOPHEN 5-325 MG PO TABS: 1.0000 | ORAL_TABLET | ORAL | 0 refills | Status: DC | PRN

## 2022-06-03 ENCOUNTER — Ambulatory Visit: Payer: BC Managed Care – PPO | Admitting: Family Medicine

## 2022-06-03 NOTE — Progress Notes (Deleted)
I,Tennille Montelongo R Sohaib Vereen,acting as a Education administrator for Lelon Huh, MD.,have documented all relevant documentation on the behalf of Lelon Huh, MD,as directed by  Lelon Huh, MD while in the presence of Lelon Huh, MD.  Established patient visit   Patient: Alexander Mcbride   DOB: Oct 09, 1955   67 y.o. Male  MRN: [id number] Visit Date: 06/03/2022  Today's healthcare provider: Lelon Huh, MD   No chief complaint on file.  Subjective    HPI  Hypertension, follow-up  BP Readings from Last 3 Encounters:  02/14/22 (!) 178/72  01/12/22 (!) 202/80  07/06/21 (!) 149/81   Wt Readings from Last 3 Encounters:  02/14/22 196 lb (88.9 kg)  01/12/22 197 lb (89.4 kg)  07/06/21 199 lb 4.8 oz (90.4 kg)     He was last seen for hypertension 3 months ago.  BP at that visit was 178/72. Management since that visit includes  double amLODipine (NORVASC) 5 MG tablet to Take 2 tablets (10 mg total) by mouth daily. Marland Kitchen  He reports {excellent/good/fair/poor:19665} compliance with treatment. He {is/is not:9024} having side effects. {document side effects if present:1} He is following a {diet:21022986} diet. He {is/is not:9024} exercising. He {does/does not:200015} smoke.  Use of agents associated with hypertension: {bp agents assoc with hypertension:511::"none"}.   Outside blood pressures are {***enter patient reported home BP readings, or 'not being checked':1}. Symptoms: {Yes/No:20286} chest pain {Yes/No:20286} chest pressure  {Yes/No:20286} palpitations {Yes/No:20286} syncope  {Yes/No:20286} dyspnea {Yes/No:20286} orthopnea  {Yes/No:20286} paroxysmal nocturnal dyspnea {Yes/No:20286} lower extremity edema   Pertinent labs Lab Results  Component Value Date   CHOL 226 (H) 01/20/2022   HDL 53 01/20/2022   LDLCALC 143 (H) 01/20/2022   LDLDIRECT 97 09/18/2020   TRIG 170 (H) 01/20/2022   CHOLHDL 4.3 01/20/2022   Lab Results  Component Value Date   NA 140 01/20/2022   K 4.8  01/20/2022   CREATININE 1.19 01/20/2022   EGFR 68 01/20/2022   GLUCOSE 116 (H) 01/20/2022     The 10-year ASCVD risk score (Arnett DK, et al., 2019) is: 24.6%  ---------------------------------------------------------------------------------------------------   Medications: Outpatient Medications Prior to Visit  Medication Sig   amLODipine (NORVASC) 5 MG tablet Take 2 tablets (10 mg total) by mouth daily.   co-enzyme Q-10 50 MG capsule Take 50 mg by mouth daily.   CREATINE PO Take 1 Dose by mouth daily.   hydrochlorothiazide (HYDRODIURIL) 12.5 MG tablet TAKE 1 TABLET BY MOUTH EVERY DAY   montelukast (SINGULAIR) 10 MG tablet TAKE 1 TABLET BY MOUTH EVERYDAY AT BEDTIME   Nutritional Supp - Diet Aids (FAT BURNER THERAPY PO) Take 1 Scoop by mouth daily.   oxyCODONE-acetaminophen (PERCOCET/ROXICET) 5-325 MG tablet Take 1 tablet by mouth every 4 (four) hours as needed (back pain).   rosuvastatin (CRESTOR) 20 MG tablet TAKE 1 TABLET BY MOUTH EVERY DAY   valsartan (DIOVAN) 160 MG tablet TAKE 1 TABLET BY MOUTH EVERY DAY   No facility-administered medications prior to visit.    Review of Systems  {Labs  Heme  Chem  Endocrine  Serology  Results Review (optional):23779}   Objective    There were no vitals taken for this visit. {Show previous vital signs (optional):23777}  Physical Exam  ***  No results found for any visits on 06/03/22.  Assessment & Plan     ***  No follow-ups on file.      {provider attestation***:1}   Lelon Huh, MD  [facility] [phone] (phone) [phone] (fax)  [facility]  

## 2022-06-08 ENCOUNTER — Other Ambulatory Visit: Payer: Self-pay | Admitting: Family Medicine

## 2022-06-08 DIAGNOSIS — M48062 Spinal stenosis, lumbar region with neurogenic claudication: Secondary | ICD-10-CM

## 2022-06-08 DIAGNOSIS — M533 Sacrococcygeal disorders, not elsewhere classified: Secondary | ICD-10-CM

## 2022-06-08 DIAGNOSIS — M47816 Spondylosis without myelopathy or radiculopathy, lumbar region: Secondary | ICD-10-CM

## 2022-06-08 NOTE — Telephone Encounter (Signed)
Medication Refill - Medication: oxyCODONE-acetaminophen (PERCOCET/ROXICET) 5-325 MG tablet [[id number]]    Has the patient contacted their pharmacy? No. (Agent: If no, request that the patient contact the pharmacy for the refill. If patient does not wish to contact the pharmacy document the reason why and proceed with request.) (Agent: If yes, when and what did the pharmacy advise?)  Preferred Pharmacy (with phone number or street name): [id number] [place]ina [address]on, [place] the patient been seen for an appointment in the last year OR does the patient have an upcoming appointment? Yes.    Agent: Please be advised that RX refills may take up to 3 business days. We ask that you follow-up with your pharmacy.

## 2022-06-09 NOTE — Telephone Encounter (Signed)
Please review. Last office visit 02/14/2022.  KP

## 2022-06-09 NOTE — Telephone Encounter (Signed)
Requested medication (s) are due for refill today: yes  Requested medication (s) are on the active medication list: yes    Last refill: 05/07/22   #160  0 refills  Future visit scheduled no  Notes to clinic:Not delegated, please review. Thank you.  Requested Prescriptions  Pending Prescriptions Disp Refills   oxyCODONE-acetaminophen (PERCOCET/ROXICET) 5-325 MG tablet 160 tablet 0    Sig: Take 1 tablet by mouth every 4 (four) hours as needed (back pain).     Not Delegated - Analgesics:  Opioid Agonist Combinations Failed - 06/08/2022  3:48 PM      Failed - This refill cannot be delegated      Failed - Urine Drug Screen completed in last 360 days      Failed - Valid encounter within last 3 months    Recent Outpatient Visits           3 months ago Essential (primary) hypertension   [place] [facility] Birdie Sons, MD   4 months ago Essential (primary) hypertension   [place] [facility] Birdie Sons, MD   11 months ago Essential (primary) hypertension   [place] [facility] Birdie Sons, MD   1 year ago Essential (primary) hypertension   [place] Birdie Sons, MD   1 year ago Hyperlipidemia, unspecified hyperlipidemia type   [facility] Birdie Sons, MD

## 2022-06-14 ENCOUNTER — Other Ambulatory Visit: Payer: Self-pay

## 2022-06-14 DIAGNOSIS — M47816 Spondylosis without myelopathy or radiculopathy, lumbar region: Secondary | ICD-10-CM

## 2022-06-14 DIAGNOSIS — M48062 Spinal stenosis, lumbar region with neurogenic claudication: Secondary | ICD-10-CM

## 2022-06-14 DIAGNOSIS — M533 Sacrococcygeal disorders, not elsewhere classified: Secondary | ICD-10-CM

## 2022-06-14 NOTE — Telephone Encounter (Signed)
Copied from [place] [phone]. Topic: General - Other >> Jun 14, 2022  9:57 AM Everette C wrote: Reason for CRM: The patient has called for an update on the status of their previously requested refill of oxyCODONE-acetaminophen (PERCOCET/ROXICET) 5-325 MG tablet [[id number]]   Please contact the patient further when possible

## 2022-06-14 NOTE — Addendum Note (Signed)
Addended by: Althea Charon D on: 06/14/2022 01:30 PM   Modules accepted: Orders

## 2022-06-15 MED ORDER — OXYCODONE-ACETAMINOPHEN 5-325 MG PO TABS: 1.0000 | ORAL_TABLET | ORAL | 0 refills | Status: DC | PRN

## 2022-06-17 ENCOUNTER — Telehealth: Payer: Self-pay | Admitting: Family Medicine

## 2022-06-17 NOTE — Telephone Encounter (Signed)
Received a fax from covermymeds for oxycodone-acetaminophen 5-381m  Key:  [id number]

## 2022-06-20 NOTE — Telephone Encounter (Signed)
PA submitted though cover my med

## 2022-06-23 NOTE — Telephone Encounter (Signed)
Patient is calling to getting clarity on way the medication is not approved. Pt states that he has been on the medication along time. Patient was advised that waiting on approval for the PA. And to speak with his insurance company.

## 2022-06-30 NOTE — Progress Notes (Signed)
Ar[place]ina Ponder [place],acting as a scribe for Lelon Huh, MD.,have documented all relevant documentation on the behalf of Lelon Huh, MD,as directed by  Lelon Huh, MD while in the presence of Lelon Huh, MD.     Established patient visit   Patient: Alexander Mcbride   DOB: Sep 19, 1955   67 y.o. Male  MRN: [id number] Visit Date: 07/01/2022  Today's healthcare provider: Lelon Huh, MD    Subjective    HPI  Hypertension, follow-up  BP Readings from Last 3 Encounters:  07/01/22 (!) 153/69  02/14/22 (!) 178/72  01/12/22 (!) 202/80   Wt Readings from Last 3 Encounters:  07/01/22 186 lb (84.4 kg)  02/14/22 196 lb (88.9 kg)  01/12/22 197 lb (89.4 kg)     He was last seen for hypertension 5 months ago.  BP at that visit was as above. Management since that visit includes increased Amlodipine to 10 mg daily.  He reports fair compliance with treatment.  He states he is not taking amlodipine. He states he stopped taking hctz at night since it was keeping him up to go to the bathroom.  He is not having side effects.  He is following a Regular diet. He is exercising. He does not smoke.  Use of agents associated with hypertension: none.   Outside blood pressures are not being checked. Symptoms: No chest pain No chest pressure  No palpitations No syncope  No dyspnea No orthopnea  No paroxysmal nocturnal dyspnea No lower extremity edema   Pertinent labs Lab Results  Component Value Date   CHOL 226 (H) 01/20/2022   HDL 53 01/20/2022   LDLCALC 143 (H) 01/20/2022   LDLDIRECT 97 09/18/2020   TRIG 170 (H) 01/20/2022   CHOLHDL 4.3 01/20/2022   Lab Results  Component Value Date   NA 140 01/20/2022   K 4.8 01/20/2022   CREATININE 1.19 01/20/2022   EGFR 68 01/20/2022   GLUCOSE 116 (H) 01/20/2022     The 10-year ASCVD risk score (Arnett DK, et al., 2019) is:  20.7%  ---------------------------------------------------------------------------------------------------   Medications: Outpatient Medications Prior to Visit  Medication Sig   co-enzyme Q-10 50 MG capsule Take 50 mg by mouth daily.   oxyCODONE-acetaminophen (PERCOCET/ROXICET) 5-325 MG tablet Take 1 tablet by mouth every 4 (four) hours as needed (back pain).   rosuvastatin (CRESTOR) 20 MG tablet TAKE 1 TABLET BY MOUTH EVERY DAY   valsartan (DIOVAN) 160 MG tablet TAKE 1 TABLET BY MOUTH EVERY DAY   amLODipine (NORVASC) 5 MG tablet Take 2 tablets (10 mg total) by mouth daily. (Patient not taking: Reported on 07/01/2022)   CREATINE PO Take 1 Dose by mouth daily. (Patient not taking: Reported on 07/01/2022)   hydrochlorothiazide (HYDRODIURIL) 12.5 MG tablet TAKE 1 TABLET BY MOUTH EVERY DAY (Patient not taking: Reported on 07/01/2022)   montelukast (SINGULAIR) 10 MG tablet TAKE 1 TABLET BY MOUTH EVERYDAY AT BEDTIME (Patient not taking: Reported on 07/01/2022)   Nutritional Supp - Diet Aids (FAT BURNER THERAPY PO) Take 1 Scoop by mouth daily. (Patient not taking: Reported on 07/01/2022)   No facility-administered medications prior to visit.    Review of Systems  Constitutional:  Negative for appetite change, chills and fever.  Respiratory:  Negative for chest tightness, shortness of breath and wheezing.   Cardiovascular:  Negative for chest pain and palpitations.  Gastrointestinal:  Negative for abdominal pain, nausea and vomiting.        Objective    BP (!) 153/69 (BP Location: Left  Arm, Patient Position: Sitting, Cuff Size: Normal)   Pulse 72   Temp 98.2 F (36.8 C) (Oral)   Wt 186 lb (84.4 kg)   SpO2 98%   BMI 29.13 kg/m    Physical Exam   General: Appearance:    Well developed, well nourished male in no acute distress  Eyes:    PERRL, conjunctiva/corneas clear, EOM's intact       Lungs:     Clear to auscultation bilaterally, respirations unlabored  Heart:    Normal heart rate.  Normal rhythm. No murmurs, rubs, or gallops.    MS:   All extremities are intact.    Neurologic:   Awake, alert, oriented x 3. No apparent focal neurological defect.         Assessment & Plan     1. Essential (primary) hypertension Much better on valsartan, although not to goal. He states he stopped taking amlodipine and hctz. He is interested in taking valsartan-hctz combo tablet in the morning only.  - valsartan-hydrochlorothiazide (DIOVAN-HCT) 160-12.5 MG tablet; Take 1 tablet by mouth daily.  Dispense: 90 tablet; Refill: 2   2. Chronic left-sided low back pain with left-sided sciatica Doing well with prn oxycodone/apap   3. Hyperlipidemia, unspecified hyperlipidemia type He is tolerating rosuvastatin well with no adverse effects.    4. Colon cancer screening  - Cologuard  Future Appointments  Date Time Provider [place]  12/02/2022  3:40 PM Caryn Section, Kirstie Peri, MD [facility]        The entirety of the information documented in the History of Present Illness, Review of Systems and Physical Exam were personally obtained by me. Portions of this information were initially documented by the CMA and reviewed by me for thoroughness and accuracy.     Lelon Huh, MD  [place] [phone] (phone) [phone] (fax)  [place]

## 2022-07-01 ENCOUNTER — Ambulatory Visit (INDEPENDENT_AMBULATORY_CARE_PROVIDER_SITE_OTHER): Payer: BC Managed Care – PPO | Admitting: Family Medicine

## 2022-07-01 VITALS — BP 153/69 | HR 72 | Temp 98.2°F | Wt 186.0 lb

## 2022-07-01 DIAGNOSIS — Z1211 Encounter for screening for malignant neoplasm of colon: Secondary | ICD-10-CM | POA: Diagnosis not present

## 2022-07-01 DIAGNOSIS — M5442 Lumbago with sciatica, left side: Secondary | ICD-10-CM

## 2022-07-01 DIAGNOSIS — I10 Essential (primary) hypertension: Secondary | ICD-10-CM

## 2022-07-01 DIAGNOSIS — E785 Hyperlipidemia, unspecified: Secondary | ICD-10-CM

## 2022-07-01 DIAGNOSIS — G8929 Other chronic pain: Secondary | ICD-10-CM

## 2022-07-01 MED ORDER — VALSARTAN-HYDROCHLOROTHIAZIDE 160-12.5 MG PO TABS: 1.0000 | ORAL_TABLET | Freq: Every day | ORAL | 2 refills | Status: DC

## 2022-07-01 NOTE — Patient Instructions (Signed)
.   Please review the attached list of medications and notify my office if there are any errors.   . Please bring all of your medications to every appointment so we can make sure that our medication list is the same as yours.   

## 2022-07-15 ENCOUNTER — Other Ambulatory Visit: Payer: Self-pay | Admitting: Family Medicine

## 2022-07-15 DIAGNOSIS — E785 Hyperlipidemia, unspecified: Secondary | ICD-10-CM

## 2022-08-01 ENCOUNTER — Other Ambulatory Visit: Payer: Self-pay | Admitting: Family Medicine

## 2022-08-01 DIAGNOSIS — M533 Sacrococcygeal disorders, not elsewhere classified: Secondary | ICD-10-CM

## 2022-08-01 DIAGNOSIS — M47816 Spondylosis without myelopathy or radiculopathy, lumbar region: Secondary | ICD-10-CM

## 2022-08-01 DIAGNOSIS — M48062 Spinal stenosis, lumbar region with neurogenic claudication: Secondary | ICD-10-CM

## 2022-08-01 NOTE — Telephone Encounter (Signed)
Medication Refill - Medication:  oxyCODONE-acetaminophen (PERCOCET/ROXICET) 5-325 MG tablet   Has the patient contacted their pharmacy? No. (Agent: If no, request that the patient contact the pharmacy for the refill. If patient does not wish to contact the pharmacy document the reason why and proceed with request.) (Agent: If yes, when and what did the pharmacy advise?)  Preferred Pharmacy (with phone number or street name):  [id number] [place] [address] Phone: [phone]  Fax: [phone]     Has the patient been seen for an appointment in the last year OR does the patient have an upcoming appointment? Yes.    Agent: Please be advised that RX refills may take up to 3 business days. We ask that you follow-up with your pharmacy.

## 2022-08-02 NOTE — Telephone Encounter (Signed)
Requested medication (s) are due for refill today: yes  Requested medication (s) are on the active medication list: yes  Last refill:  06/15/22  Future visit scheduled: yes  Notes to clinic:  Unable to refill per protocol, cannot delegate.      Requested Prescriptions  Pending Prescriptions Disp Refills   oxyCODONE-acetaminophen (PERCOCET/ROXICET) 5-325 MG tablet 160 tablet 0    Sig: Take 1 tablet by mouth every 4 (four) hours as needed (back pain).     Not Delegated - Analgesics:  Opioid Agonist Combinations Failed - 08/01/2022  3:41 PM      Failed - This refill cannot be delegated      Failed - Urine Drug Screen completed in last 360 days      Passed - Valid encounter within last 3 months    Recent Outpatient Visits           1 month ago Essential (primary) hypertension   [place] Birdie Sons, MD   5 months ago Essential (primary) hypertension   [place] Birdie Sons, MD   6 months ago Essential (primary) hypertension   [place] Birdie Sons, MD   1 year ago Essential (primary) hypertension   [place], Donald E, MD   1 year ago Essential (primary) hypertension   [place], Donald E, MD       Future Appointments             In 4 months Fisher, Kirstie Peri, MD [facility], [facility]

## 2022-08-03 MED ORDER — OXYCODONE-ACETAMINOPHEN 5-325 MG PO TABS: 1.0000 | ORAL_TABLET | ORAL | 0 refills | Status: DC | PRN

## 2022-09-06 ENCOUNTER — Other Ambulatory Visit: Payer: Self-pay | Admitting: Family Medicine

## 2022-09-06 DIAGNOSIS — M48062 Spinal stenosis, lumbar region with neurogenic claudication: Secondary | ICD-10-CM

## 2022-09-06 DIAGNOSIS — M47816 Spondylosis without myelopathy or radiculopathy, lumbar region: Secondary | ICD-10-CM

## 2022-09-06 DIAGNOSIS — M533 Sacrococcygeal disorders, not elsewhere classified: Secondary | ICD-10-CM

## 2022-09-06 NOTE — Telephone Encounter (Signed)
Medication Refill - Medication:  oxyCODONE-acetaminophen (PERCOCET/ROXICET) 5-325 MG tablet   Has the patient contacted their pharmacy? No. (Agent: If no, request that the patient contact the pharmacy for the refill. If patient does not wish to contact the pharmacy document the reason why and proceed with request.) (Agent: If yes, when and what did the pharmacy advise?)  Preferred Pharmacy (with phone number or street name):  [id number] [place] [place] - [address] Phone: [phone]  Fax: [phone]     Has the patient been seen for an appointment in the last year OR does the patient have an upcoming appointment? Yes.    Agent: Please be advised that RX refills may take up to 3 business days. We ask that you follow-up with your pharmacy.  

## 2022-09-07 NOTE — Telephone Encounter (Signed)
Requested medication (s) are due for refill today:   Provider to review  Requested medication (s) are on the active medication list:   Yes  Future visit scheduled:   Yes   Last ordered: 08/03/2022 #160, 0 refills  Non delegated refill    Requested Prescriptions  Pending Prescriptions Disp Refills   oxyCODONE-acetaminophen (PERCOCET/ROXICET) 5-325 MG tablet 160 tablet 0    Sig: Take 1 tablet by mouth every 4 (four) hours as needed (back pain).     Not Delegated - Analgesics:  Opioid Agonist Combinations Failed - 09/06/2022  5:08 PM      Failed - This refill cannot be delegated      Failed - Urine Drug Screen completed in last 360 days      Passed - Valid encounter within last 3 months    Recent Outpatient Visits           2 months ago Essential (primary) hypertension   [place] [facility]va Limes, MD   6 months ago Essential (primary) hypertension   [place] [facility] Malva Limes, MD   7 months ago Essential (primary) hypertension   [place] [facility] Malva Limes, MD   1 year ago Essential (primary) hypertension   [facility]va Limes, MD   1 year ago Essential (primary) hypertension    [facility]       Future Appointments             In 2 months Fisher, Demetrios Isaacs, MD [facility], [facility]

## 2022-09-08 MED ORDER — OXYCODONE-ACETAMINOPHEN 5-325 MG PO TABS: 1.0000 | ORAL_TABLET | ORAL | 0 refills | Status: DC | PRN

## 2022-10-04 ENCOUNTER — Other Ambulatory Visit: Payer: Self-pay | Admitting: Family Medicine

## 2022-10-04 DIAGNOSIS — M47816 Spondylosis without myelopathy or radiculopathy, lumbar region: Secondary | ICD-10-CM

## 2022-10-04 DIAGNOSIS — M533 Sacrococcygeal disorders, not elsewhere classified: Secondary | ICD-10-CM

## 2022-10-04 DIAGNOSIS — M48062 Spinal stenosis, lumbar region with neurogenic claudication: Secondary | ICD-10-CM

## 2022-10-04 NOTE — Telephone Encounter (Signed)
Medication Refill - Medication: oxycodone 5/325  Has the patient contacted their pharmacy? No. (Agent: If no, request that the patient contact the pharmacy for the refill. If patient does not wish to contact the pharmacy document the reason why and proceed with request.) (Agent: If yes, when and what did the pharmacy advise?)  Preferred Pharmacy (with phone number or street name): [facility] [address] Has the patient been seen for an appointment in the last year OR does the patient have an upcoming appointment? Yes.    Agent: Please be advised that RX refills may take up to 3 business days. We ask that you follow-up with your pharmacy.

## 2022-10-05 MED ORDER — OXYCODONE-ACETAMINOPHEN 5-325 MG PO TABS: 1.0000 | ORAL_TABLET | ORAL | 0 refills | Status: DC | PRN

## 2022-10-05 NOTE — Telephone Encounter (Signed)
Requested medication (s) are due for refill today: coming up due  Requested medication (s) are on the active medication list: yes  Last refill:  09/08/22 #160/0  Future visit scheduled: yes  Notes to clinic:  Unable to refill per protocol, cannot delegate.    Requested Prescriptions  Pending Prescriptions Disp Refills   oxyCODONE-acetaminophen (PERCOCET/ROXICET) 5-325 MG tablet 160 tablet 0    Sig: Take 1 tablet by mouth every 4 (four) hours as needed (back pain).     Not Delegated - Analgesics:  Opioid Agonist Combinations Failed - 10/04/2022  5:34 PM      Failed - This refill cannot be delegated      Failed - Urine Drug Screen completed in last 360 days      Failed - Valid encounter within last 3 months    Recent Outpatient Visits           3 months ago Essential (primary) hypertension   [facility] Malva Limes, MD   7 months ago Essential (primary) hypertension   [place] [facility]va Limes, MD   8 months ago Essential (primary) hypertension   [place] [facility]va Limes, MD   1 year ago Essential (primary) hypertension   [place] [facility] Malva Limes, MD   1 year ago Essential (primary) hypertension   [place] [facility] Malva Limes, MD       Future Appointments             In 1 month Fisher, Demetrios Isaacs, MD [facility], [facility]

## 2022-10-15 ENCOUNTER — Other Ambulatory Visit: Payer: Self-pay | Admitting: Family Medicine

## 2022-10-15 DIAGNOSIS — E785 Hyperlipidemia, unspecified: Secondary | ICD-10-CM

## 2022-10-17 NOTE — Telephone Encounter (Signed)
Requested Prescriptions  Pending Prescriptions Disp Refills   rosuvastatin (CRESTOR) 20 MG tablet [Pharmacy Med Name: ROSUVASTATIN CALCIUM 20 MG TAB] 90 tablet 0    Sig: TAKE 1 TABLET BY MOUTH EVERY DAY     Cardiovascular:  Antilipid - Statins 2 Failed - 10/15/2022  9:39 AM      Failed - Lipid Panel in normal range within the last 12 months    Cholesterol, Total  Date Value Ref Range Status  01/20/2022 226 (H) 100 - 199 mg/dL Final   LDL Chol Calc (NIH)  Date Value Ref Range Status  01/20/2022 143 (H) 0 - 99 mg/dL Final   LDL Direct  Date Value Ref Range Status  09/18/2020 97 0 - 99 mg/dL Final   HDL  Date Value Ref Range Status  01/20/2022 53 >39 mg/dL Final   Triglycerides  Date Value Ref Range Status  01/20/2022 170 (H) 0 - 149 mg/dL Final         Passed - Cr in normal range and within 360 days    Creatinine, Ser  Date Value Ref Range Status  01/20/2022 1.19 0.76 - 1.27 mg/dL Final         Passed - Patient is not pregnant      Passed - Valid encounter within last 12 months    Recent Outpatient Visits           3 months ago Essential (primary) hypertension   [place] [facility] Malva Limes, MD   8 months ago Essential (primary) hypertension   [place] [facility] Malva Limes, MD   9 months ago Essential (primary) hypertension   [place] [facility] Malva Limes, MD   1 year ago Essential (primary) hypertension   [place] Malva Limes, MD   1 year ago Essential (primary) hypertension   [place] Malva Limes, MD       Future Appointments             In 1 month Fisher, Demetrios Isaacs, MD [facility], [facility]

## 2022-11-16 ENCOUNTER — Other Ambulatory Visit: Payer: Self-pay | Admitting: Family Medicine

## 2022-11-16 DIAGNOSIS — M533 Sacrococcygeal disorders, not elsewhere classified: Secondary | ICD-10-CM

## 2022-11-16 DIAGNOSIS — M48062 Spinal stenosis, lumbar region with neurogenic claudication: Secondary | ICD-10-CM

## 2022-11-16 DIAGNOSIS — M47816 Spondylosis without myelopathy or radiculopathy, lumbar region: Secondary | ICD-10-CM

## 2022-11-16 NOTE — Telephone Encounter (Signed)
Medication Refill - Medication: oxyCODONE-acetaminophen (PERCOCET/ROXICET) 5-325 MG tablet   Has the patient contacted their pharmacy? No   Preferred Pharmacy (with phone number or street name):  [id number] [place] [place] - [address] Phone: [phone]  Fax: [phone]    Has the patient been seen for an appointment in the last year OR does the patient have an upcoming appointment? Yes.    Agent: Please be advised that RX refills may take up to 3 business days. We ask that you follow-up with your pharmacy. 

## 2022-11-17 NOTE — Telephone Encounter (Signed)
Requested medication (s) are due for refill today: yes  Requested medication (s) are on the active medication list: yes  Last refill:  10/05/22  Future visit scheduled: yes  Notes to clinic:  Unable to refill per protocol, cannot delegate.      Requested Prescriptions  Pending Prescriptions Disp Refills   oxyCODONE-acetaminophen (PERCOCET/ROXICET) 5-325 MG tablet 160 tablet 0    Sig: Take 1 tablet by mouth every 4 (four) hours as needed (back pain).     Not Delegated - Analgesics:  Opioid Agonist Combinations Failed - 11/16/2022  4:20 PM      Failed - This refill cannot be delegated      Failed - Urine Drug Screen completed in last 360 days      Failed - Valid encounter within last 3 months    Recent Outpatient Visits           4 months ago Essential (primary) hypertension   [place] Mal[facility] Limes, MD   9 months ago Essential (primary) hypertension   [place] [facility] Malva Limes, MD   10 months ago Essential (primary) hypertension   [facility] [facility] Malva Limes, MD   1 year ago Essential (primary) hypertension   [facility] Malva Limes, MD   1 year ago Essential (primary) hypertension   [place] [facility] Malva Limes, MD       Future Appointments             In 2 weeks Fisher, Demetrios Isaacs, MD [facility], [facility]

## 2022-11-19 MED ORDER — OXYCODONE-ACETAMINOPHEN 5-325 MG PO TABS: 1.0000 | ORAL_TABLET | ORAL | 0 refills | Status: DC | PRN

## 2022-12-02 ENCOUNTER — Encounter: Payer: BC Managed Care – PPO | Admitting: Family Medicine

## 2022-12-05 ENCOUNTER — Encounter: Payer: Self-pay | Admitting: Family Medicine

## 2022-12-05 ENCOUNTER — Ambulatory Visit (INDEPENDENT_AMBULATORY_CARE_PROVIDER_SITE_OTHER): Payer: BC Managed Care – PPO | Admitting: Family Medicine

## 2022-12-05 VITALS — BP 203/83 | HR 73 | Ht 67.0 in | Wt 186.7 lb

## 2022-12-05 DIAGNOSIS — Z0000 Encounter for general adult medical examination without abnormal findings: Secondary | ICD-10-CM | POA: Diagnosis not present

## 2022-12-05 DIAGNOSIS — R739 Hyperglycemia, unspecified: Secondary | ICD-10-CM

## 2022-12-05 DIAGNOSIS — M5134 Other intervertebral disc degeneration, thoracic region: Secondary | ICD-10-CM

## 2022-12-05 DIAGNOSIS — M5136 Other intervertebral disc degeneration, lumbar region: Secondary | ICD-10-CM | POA: Diagnosis not present

## 2022-12-05 DIAGNOSIS — I10 Essential (primary) hypertension: Secondary | ICD-10-CM | POA: Diagnosis not present

## 2022-12-05 DIAGNOSIS — G8929 Other chronic pain: Secondary | ICD-10-CM

## 2022-12-05 DIAGNOSIS — F1190 Opioid use, unspecified, uncomplicated: Secondary | ICD-10-CM

## 2022-12-05 DIAGNOSIS — E785 Hyperlipidemia, unspecified: Secondary | ICD-10-CM

## 2022-12-05 DIAGNOSIS — Z125 Encounter for screening for malignant neoplasm of prostate: Secondary | ICD-10-CM

## 2022-12-05 DIAGNOSIS — M5442 Lumbago with sciatica, left side: Secondary | ICD-10-CM

## 2022-12-05 DIAGNOSIS — Z114 Encounter for screening for human immunodeficiency virus [HIV]: Secondary | ICD-10-CM

## 2022-12-05 DIAGNOSIS — B351 Tinea unguium: Secondary | ICD-10-CM

## 2022-12-05 NOTE — Progress Notes (Unsigned)
Established patient visit   Patient: Alexander Mcbride   DOB: [birth year]   67 y.o. Male  MRN: [id number] Visit Date: 12/05/2022  Today's healthcare provider: [name], MD   Chief Complaint  Patient presents with   Annual Exam   Subjective    Discussed the use of AI scribe software for clinical note transcription with the patient, who gave verbal consent to proceed.  History of Present Illness   The patient, with a history of hypertension, hyperlipemia and chronic low back pain, presented for a routine physical examination. He reported a sensation of blocked ears, suspecting wax build-up as the cause. He described the sensation as being able to hear, but not clearly.  Additionally, the patient complained of a painful, sensitive left great toenail, which he believed might be an ingrown toenail. He reported having attempted to cut the nail themselves, which seemed to exacerbate the issue. He also noted a discoloration in some of their toenails, suspecting it to be a fungal infection.  The patient also reported a recent increase in blood pressure, attributing it to a high-sodium diet. He confirmed adherence to their prescribed blood pressure medications. No other new symptoms or health concerns were reported.       Medications: Outpatient Medications Prior to Visit  Medication Sig   co-enzyme Q-10 50 MG capsule Take 50 mg by mouth daily.   oxyCODONE-acetaminophen (PERCOCET/ROXICET) 5-325 MG tablet Take 1 tablet by mouth every 4 (four) hours as needed (back pain).   rosuvastatin (CRESTOR) 20 MG tablet TAKE 1 TABLET BY MOUTH EVERY DAY   valsartan-hydrochlorothiazide (DIOVAN-HCT) 160-12.5 MG tablet Take 1 tablet by mouth daily.   CREATINE PO Take 1 Dose by mouth daily. (Patient not taking: Reported on 07/01/2022)   montelukast (SINGULAIR) 10 MG tablet TAKE 1 TABLET BY MOUTH EVERYDAY AT BEDTIME (Patient not taking: Reported on 07/01/2022)   Nutritional Supp - Diet Aids (FAT  BURNER THERAPY PO) Take 1 Scoop by mouth daily. (Patient not taking: Reported on 07/01/2022)   No facility-administered medications prior to visit.   Review of Systems  Constitutional:  Negative for appetite change, chills and fever.  Respiratory:  Negative for chest tightness, shortness of breath and wheezing.   Cardiovascular:  Negative for chest pain and palpitations.  Gastrointestinal:  Negative for abdominal pain, nausea and vomiting.      Objective    BP (!) 203/83 (BP Location: Right Arm, Patient Position: Sitting, Cuff Size: Large)   Pulse 73   Ht 5\' 7"  (1.702 m)   Wt 186 lb 11.2 oz (84.7 kg)   BMI 29.24 kg/m {Insert last BP/Wt (optional):23777}  {See vitals history (optional):1} Physical Exam   General Appearance:    Well developed, well nourished male. Alert, cooperative, in no acute distress, appears stated age  Head:    Normocephalic, without obvious abnormality, atraumatic  Eyes:    PERRL, conjunctiva/corneas clear, EOM's intact, fundi    benign, both eyes       Ears:    Both ear canal obstructed by cerumen  Nose:   Nares normal, septum midline, mucosa normal, no drainage   or sinus tenderness  Throat:   Lips, mucosa, and tongue normal; teeth and gums normal  Neck:   Supple, symmetrical, trachea midline, no adenopathy;       thyroid:  No enlargement/tenderness/nodules; no carotid   bruit or JVD  Back:     Symmetric, no curvature, ROM normal, no CVA tenderness  Lungs:  Clear to auscultation bilaterally, respirations unlabored  Chest wall:    No tenderness or deformity  Heart:    Normal heart rate. Normal rhythm. No murmurs, rubs, or gallops.  S1 and S2 normal  Abdomen:     Soft, non-tender, bowel sounds active all four quadrants,    no masses, no organomegaly  Genitalia:    deferred  Rectal:    deferred  Extremities:   All extremities are intact. No cyanosis or edema  Pulses:   2+ and symmetric all extremities  Skin:   Skin color, texture, turgor normal, no  rashes or lesions  Lymph nodes:   Cervical, supraclavicular, and axillary nodes normal  Neurologic:   CNII-XII intact. Normal strength, sensation and reflexes      throughout     Assessment & Plan     Assessment and Plan    Routine complete physical exam:  -Order routine blood work. -Follow-up as needed. -Recommended Prevnar 20 which he declined.  -He already has Cologuard kit at home   Hypertension: Elevated blood pressure likely secondary to high sodium intake. Patient is compliant with medication. -Continue current antihypertensive regimen. -Advise patient to limit sodium intake.  Hyperlipidemia: Doing well on rosuvastatin, check lipids.   Onychomycosis: Fungal infection of the toenails. Discussed treatment options including topical Vicks VapoRub and oral Lamisil. -Start topical Vicks VapoRub nightly on affected nails. -Consider oral Lamisil if no improvement with topical treatment.  Ingrown Toenail: Pain and sensitivity in the toe due to improper nail cutting. -Advise proper nail cutting technique to prevent further ingrown toenails.  Cerumen Impaction: Decreased hearing likely due to cerumen impaction. -Start Debrox drops nightly before bed for a few days. -Consider ear irrigation if no improvement with Debrox drops.          [name], MD  [facility] [phone] (phone) [phone] (fax)  [facility]

## 2022-12-05 NOTE — Patient Instructions (Signed)
Please review the attached list of medications and notify my office if there are any errors.   I recommend that you get the Prevnar 20 vaccine to protect yourself from certain dangerous strains of pneumonia. You can get Prevnar 20 at your pharmacy, or call our office at [phone] at your earliest convenience to schedule this vaccine.   Try over the counter Debrox ear drops to remove wax from your ears   You can try applying Vick's Vaporub to your toenails every night to try to clear up the nail fungus. It will take a few months to see results. If they're are still not clearing up then let me know and we get a prescription for Lamisil pills.

## 2022-12-09 ENCOUNTER — Ambulatory Visit: Payer: BC Managed Care – PPO | Admitting: Physician Assistant

## 2022-12-09 ENCOUNTER — Encounter: Payer: Self-pay | Admitting: Physician Assistant

## 2022-12-09 VITALS — BP 208/82 | HR 69 | Temp 98.2°F | Ht 67.0 in | Wt 189.0 lb

## 2022-12-09 DIAGNOSIS — R739 Hyperglycemia, unspecified: Secondary | ICD-10-CM | POA: Diagnosis not present

## 2022-12-09 DIAGNOSIS — Z125 Encounter for screening for malignant neoplasm of prostate: Secondary | ICD-10-CM | POA: Diagnosis not present

## 2022-12-09 DIAGNOSIS — I10 Essential (primary) hypertension: Secondary | ICD-10-CM | POA: Diagnosis not present

## 2022-12-09 DIAGNOSIS — E785 Hyperlipidemia, unspecified: Secondary | ICD-10-CM | POA: Diagnosis not present

## 2022-12-09 DIAGNOSIS — H6123 Impacted cerumen, bilateral: Secondary | ICD-10-CM | POA: Diagnosis not present

## 2022-12-09 DIAGNOSIS — Z114 Encounter for screening for human immunodeficiency virus [HIV]: Secondary | ICD-10-CM | POA: Diagnosis not present

## 2022-12-09 NOTE — Progress Notes (Unsigned)
  Established patient visit  Patient: Alexander Mcbride   DOB: [birth year]   67 y.o. Male  MRN: [id number] Visit Date: 12/09/2022  Today's healthcare provider: Debera Lat, PA-C   Chief Complaint  Patient presents with   Medical Management of Chronic Issues    Ear clogged, bilateral, right over left, seen Dr. Sherrie Mustache who advised OTC ear drops but now he can barley hear    Subjective    HPI HPI     Medical Management of Chronic Issues    Additional comments: Ear clogged, bilateral, right over left, seen Dr. Sherrie Mustache who advised OTC ear drops but now he can barley hear       Last edited by Rolly Salter, CMA on 12/09/2022  2:52 PM.      *** Discussed the use of AI scribe software for clinical note transcription with the patient, who gave verbal consent to proceed.  History of Present Illness               07/01/2022    3:35 PM 07/06/2021    3:31 PM 03/09/2021    3:44 PM  Depression screen PHQ 2/9  Decreased Interest 0 0 0  Down, Depressed, Hopeless 0 0 0  PHQ - 2 Score 0 0 0  Altered sleeping 1 0   Tired, decreased energy 0 0   Change in appetite 0 0   Feeling bad or failure about yourself  0 0   Trouble concentrating 0 0   Moving slowly or fidgety/restless 0 0   Suicidal thoughts 0 0   PHQ-9 Score 1 0   Difficult doing work/chores Not difficult at all Not difficult at all        No data to display          Medications: Outpatient Medications Prior to Visit  Medication Sig   co-enzyme Q-10 50 MG capsule Take 50 mg by mouth daily.   oxyCODONE-acetaminophen (PERCOCET/ROXICET) 5-325 MG tablet Take 1 tablet by mouth every 4 (four) hours as needed (back pain).   rosuvastatin (CRESTOR) 20 MG tablet TAKE 1 TABLET BY MOUTH EVERY DAY   valsartan-hydrochlorothiazide (DIOVAN-HCT) 160-12.5 MG tablet Take 1 tablet by mouth daily.   No facility-administered medications prior to visit.    Review of Systems Except see HPI   {Insert previous labs  (optional):23779}  {See past labs  Heme  Chem  Endocrine  Serology  Results Review (optional):1}   Objective    BP (!) 208/82 (BP Location: Left Arm, Patient Position: Sitting, Cuff Size: Large)   Pulse 69   Temp 98.2 F (36.8 C) (Oral)   Ht 5\' 7"  (1.702 m)   Wt 189 lb (85.7 kg)   SpO2 99%   BMI 29.60 kg/m  {Insert last BP/Wt (optional):23777}  {See vitals history (optional):1}  Physical Exam   No results found for any visits on 12/09/22.  Assessment & Plan    *** Assessment and Plan              No follow-ups on file.      [facility]

## 2022-12-13 ENCOUNTER — Other Ambulatory Visit: Payer: Self-pay | Admitting: Family Medicine

## 2022-12-13 ENCOUNTER — Ambulatory Visit (INDEPENDENT_AMBULATORY_CARE_PROVIDER_SITE_OTHER): Payer: BC Managed Care – PPO | Admitting: Family Medicine

## 2022-12-13 VITALS — BP 165/85 | HR 73 | Temp 98.4°F | Ht 67.0 in | Wt 182.0 lb

## 2022-12-13 DIAGNOSIS — M533 Sacrococcygeal disorders, not elsewhere classified: Secondary | ICD-10-CM

## 2022-12-13 DIAGNOSIS — I10 Essential (primary) hypertension: Secondary | ICD-10-CM | POA: Diagnosis not present

## 2022-12-13 DIAGNOSIS — M48062 Spinal stenosis, lumbar region with neurogenic claudication: Secondary | ICD-10-CM

## 2022-12-13 DIAGNOSIS — M47816 Spondylosis without myelopathy or radiculopathy, lumbar region: Secondary | ICD-10-CM

## 2022-12-13 NOTE — Patient Instructions (Signed)
.   Please review the attached list of medications and notify my office if there are any errors.   . Please bring all of your medications to every appointment so we can make sure that our medication list is the same as yours.   

## 2022-12-13 NOTE — Telephone Encounter (Signed)
Medication Refill - Medication: oxyCODONE-acetaminophen (PERCOCET/ROXICET) 5-325 MG table   Has the patient contacted their pharmacy? No.  Preferred Pharmacy (with phone number or street name):  [id number] [place] [address] Phone: [phone]  Fax: [phone]     Has the patient been seen for an appointment in the last year OR does the patient have an upcoming appointment? Yes  Agent: Please be advised that RX refills may take up to 3 business days. We ask that you follow-up with your pharmacy.

## 2022-12-13 NOTE — Progress Notes (Signed)
      Established patient visit   Patient: Alexander Mcbride   DOB: 12-10-1955   67 y.o. Male  MRN: [id number] Visit Date: 12/13/2022  Today's healthcare provider: [name], MD   Chief Complaint  Patient presents with   Hypertension   Subjective    HPI HPI   Patient was last seen on 12/09/22. At that time he was advised to increase his Valsartan hydrochlorothiazide to twice a day.  Patient states that on the lower dose he had a buzzing in his head but now he has increased the medication he no longer has the buzzing.  Patient has not been checking it at home and states he knows his diet needs to change. Last edited by Adline Peals, CMA on 12/13/2022 10:08 AM.        Medications: Outpatient Medications Prior to Visit  Medication Sig   co-enzyme Q-10 50 MG capsule Take 50 mg by mouth daily.   oxyCODONE-acetaminophen (PERCOCET/ROXICET) 5-325 MG tablet Take 1 tablet by mouth every 4 (four) hours as needed (back pain).   rosuvastatin (CRESTOR) 20 MG tablet TAKE 1 TABLET BY MOUTH EVERY DAY   valsartan-hydrochlorothiazide (DIOVAN-HCT) 160-12.5 MG tablet Take 1 tablet by mouth daily. (Patient taking differently: Take 1 tablet by mouth 2 (two) times daily.)   No facility-administered medications prior to visit.        Objective    BP (!) 165/85 (BP Location: Left Arm, Patient Position: Sitting, Cuff Size: Normal)   Pulse 73   Temp 98.4 F (36.9 C) (Oral)   Ht 5\' 7"  (1.702 m)   Wt 182 lb (82.6 kg)   SpO2 99%   BMI 28.51 kg/m   Physical Exam  General appearance: Well developed, well nourished male, cooperative and in no acute distress Head: Normocephalic, without obvious abnormality, atraumatic Respiratory: Respirations even and unlabored, normal respiratory rate Extremities: All extremities are intact.  Skin: Skin color, texture, turgor normal. No rashes seen  Psych: Appropriate mood and affect. Neurologic: Mental status: Alert, oriented to person, place, and  time, thought content appropriate.   Assessment & Plan     1. Essential (primary) hypertension Much better since doubling dose of valsartan-hydrochlorothiazide. Will send prescription for 320/25 to his pharmacy next week.       The entirety of the information documented in the History of Present Illness, Review of Systems and Physical Exam were personally obtained by me. Portions of this information were initially documented by the CMA and reviewed by me for thoroughness and accuracy.     [name], MD  [facility] [phone] (phone) [phone] (fax)  [facility]

## 2022-12-14 NOTE — Telephone Encounter (Signed)
Requested medication (s) are due for refill today: routing for review  Requested medication (s) are on the active medication list: yes  Last refill:  11/19/22  Future visit scheduled: yes  Notes to clinic:  Unable to refill per protocol, cannot delegate.      Requested Prescriptions  Pending Prescriptions Disp Refills   oxyCODONE-acetaminophen (PERCOCET/ROXICET) 5-325 MG tablet 160 tablet 0    Sig: Take 1 tablet by mouth every 4 (four) hours as needed (back pain).     Not Delegated - Analgesics:  Opioid Agonist Combinations Failed - 12/13/2022  1:43 PM      Failed - This refill cannot be delegated      Failed - Urine Drug Screen completed in last 360 days      Passed - Valid encounter within last 3 months    Recent Outpatient Visits           Yesterday Essential (primary) hypertension   [facility] Malva Limes, MD   5 days ago Uncontrolled hypertension   [place] [place] [facility] [place], [place], PA-C   1 week ago Annual physical exam   [facility]va [facility], MD   5 months ago Essential (primary) hypertension   [place] [facility] Malva Limes, MD   10 months ago Essential (primary) hypertension   [place] Malva Limes, MD       Future Appointments             In 2 months Fisher, Demetrios Isaacs, MD [facility], [facility]

## 2022-12-15 MED ORDER — OXYCODONE-ACETAMINOPHEN 5-325 MG PO TABS
1.0000 | ORAL_TABLET | ORAL | 0 refills | Status: DC | PRN
Start: 2022-12-15 — End: 2023-01-18

## 2022-12-19 ENCOUNTER — Other Ambulatory Visit: Payer: Self-pay | Admitting: Family Medicine

## 2022-12-19 DIAGNOSIS — I10 Essential (primary) hypertension: Secondary | ICD-10-CM

## 2022-12-19 MED ORDER — VALSARTAN-HYDROCHLOROTHIAZIDE 320-25 MG PO TABS
1.0000 | ORAL_TABLET | Freq: Every day | ORAL | 3 refills | Status: DC
Start: 2022-12-19 — End: 2023-11-19

## 2023-01-18 ENCOUNTER — Other Ambulatory Visit: Payer: Self-pay | Admitting: Family Medicine

## 2023-01-18 DIAGNOSIS — M533 Sacrococcygeal disorders, not elsewhere classified: Secondary | ICD-10-CM

## 2023-01-18 DIAGNOSIS — M48062 Spinal stenosis, lumbar region with neurogenic claudication: Secondary | ICD-10-CM

## 2023-01-18 DIAGNOSIS — M47816 Spondylosis without myelopathy or radiculopathy, lumbar region: Secondary | ICD-10-CM

## 2023-01-18 NOTE — Telephone Encounter (Signed)
Medication Refill - Medication: oxyCODONE-acetaminophen (PERCOCET/ROXICET) 5-325 MG tablet [[id number]]   Has the patient contacted their pharmacy? Yes.    (Agent: If yes, when and what did the pharmacy advise?) Contact PCP   Preferred Pharmacy (with phone number or street name): [id number] [place], [place] - [address]   Has the patient been seen for an appointment in the last year OR does the patient have an upcoming appointment? Yes.    Agent: Please be advised that RX refills may take up to 3 business days. We ask that you follow-up with your pharmacy.

## 2023-01-19 MED ORDER — OXYCODONE-ACETAMINOPHEN 5-325 MG PO TABS
1.0000 | ORAL_TABLET | ORAL | 0 refills | Status: DC | PRN
Start: 2023-01-19 — End: 2023-02-21

## 2023-01-19 NOTE — Telephone Encounter (Signed)
Requested medication (s) are due for refill today: Yes  Requested medication (s) are on the active medication list: Yes  Last refill:  12/15/22, #160  Future visit scheduled: Yes  Notes to clinic:  Unable to refill per protocol, cannot delegate.      Requested Prescriptions  Pending Prescriptions Disp Refills   oxyCODONE-acetaminophen (PERCOCET/ROXICET) 5-325 MG tablet 160 tablet 0    Sig: Take 1 tablet by mouth every 4 (four) hours as needed (back pain).     Not Delegated - Analgesics:  Opioid Agonist Combinations Failed - 01/18/2023 10:25 AM      Failed - This refill cannot be delegated      Failed - Urine Drug Screen completed in last 360 days      Passed - Valid encounter within last 3 months    Recent Outpatient Visits           1 month ago Essential (primary) hypertension   [facility], MD   1 month ago Uncontrolled hypertension   [place] [facility] [place], [place], PA-C   1 month ago Annual physical exam   [facility] Malva Limes, MD   6 months ago Essential (primary) hypertension   [place] Malva Limes, MD   11 months ago Essential (primary) hypertension   [facility] Malva Limes, MD       Future Appointments             In 3 weeks Fisher, Demetrios Isaacs, MD [facility], [facility]

## 2023-02-11 ENCOUNTER — Other Ambulatory Visit: Payer: Self-pay | Admitting: Family Medicine

## 2023-02-11 DIAGNOSIS — E785 Hyperlipidemia, unspecified: Secondary | ICD-10-CM

## 2023-02-13 ENCOUNTER — Ambulatory Visit: Payer: BC Managed Care – PPO | Admitting: Family Medicine

## 2023-02-13 NOTE — Telephone Encounter (Signed)
Requested Prescriptions  Pending Prescriptions Disp Refills   rosuvastatin (CRESTOR) 20 MG tablet [Pharmacy Med Name: ROSUVASTATIN CALCIUM 20 MG TAB] 90 tablet 2    Sig: TAKE 1 TABLET BY MOUTH EVERY DAY     Cardiovascular:  Antilipid - Statins 2 Failed - 02/11/2023  8:52 AM      Failed - Lipid Panel in normal range within the last 12 months    Cholesterol, Total  Date Value Ref Range Status  12/09/2022 147 100 - 199 mg/dL Final   LDL Chol Calc (NIH)  Date Value Ref Range Status  12/09/2022 74 0 - 99 mg/dL Final   LDL Direct  Date Value Ref Range Status  09/18/2020 97 0 - 99 mg/dL Final   HDL  Date Value Ref Range Status  12/09/2022 56 >39 mg/dL Final   Triglycerides  Date Value Ref Range Status  12/09/2022 93 0 - 149 mg/dL Final         Passed - Cr in normal range and within 360 days    Creatinine, Ser  Date Value Ref Range Status  12/09/2022 1.22 0.76 - 1.27 mg/dL Final         Passed - Patient is not pregnant      Passed - Valid encounter within last 12 months    Recent Outpatient Visits           2 months ago Essential (primary) hypertension   [place] [facility] Malva Limes, MD   2 months ago Uncontrolled hypertension   [place] [facility] [place], [place], PA-C   2 months ago Annual physical exam   [facility], [facility]   7 months ago Essential (primary) hypertension   [place] [facility] Malva Limes, MD   12 months ago Essential (primary) hypertension   [place] [facility] Fisher, Demetrios Isaacs, MD       Future Appointments             Today Fisher, Demetrios Isaacs, MD [facility], [facility]

## 2023-02-15 ENCOUNTER — Ambulatory Visit: Payer: BC Managed Care – PPO | Admitting: Family Medicine

## 2023-02-15 VITALS — BP 162/68 | HR 64 | Temp 98.2°F | Ht 67.0 in | Wt 185.0 lb

## 2023-02-15 DIAGNOSIS — Z23 Encounter for immunization: Secondary | ICD-10-CM | POA: Diagnosis not present

## 2023-02-15 DIAGNOSIS — N410 Acute prostatitis: Secondary | ICD-10-CM | POA: Diagnosis not present

## 2023-02-15 DIAGNOSIS — I10 Essential (primary) hypertension: Secondary | ICD-10-CM | POA: Diagnosis not present

## 2023-02-15 MED ORDER — DOXYCYCLINE HYCLATE 100 MG PO TABS
100.0000 mg | ORAL_TABLET | Freq: Two times a day (BID) | ORAL | 0 refills | Status: AC
Start: 2023-02-15 — End: 2023-02-25

## 2023-02-15 NOTE — Patient Instructions (Signed)
.   Please review the attached list of medications and notify my office if there are any errors.   . Please bring all of your medications to every appointment so we can make sure that our medication list is the same as yours.   

## 2023-02-15 NOTE — Progress Notes (Signed)
Established patient visit   Patient: Alexander Mcbride   DOB: [birth year]   67 y.o. Male  MRN: [id number] Visit Date: 02/15/2023  Today's healthcare provider: [name], MD   Chief Complaint  Patient presents with   Hypertension    Patient was last seen in August.  He continues on the double dose of Valsartan.  He states he took his blood pressure a few times at home but does not have the record with him. He states he had a large breakfast this morning and just took his medication shortly before coming to the office.     Subjective    Hypertension Pertinent negatives include no chest pain, palpitations or shortness of breath.   Follow up since increasing valsartan-hydrochlorothiazide dose in August.  BP Readings from Last 3 Encounters:  02/15/23 (!) 162/68  12/13/22 (!) 165/85  12/09/22 (!) 208/82   Lab Results  Component Value Date   NA 144 12/09/2022   K 4.0 12/09/2022   CREATININE 1.22 12/09/2022   EGFR 66 12/09/2022   GLUCOSE 96 12/09/2022   Tolerating well, no side effects. Does report burning sensation that lasts a few days after having intercourse.   Medications: Outpatient Medications Prior to Visit  Medication Sig   co-enzyme Q-10 50 MG capsule Take 50 mg by mouth daily.   oxyCODONE-acetaminophen (PERCOCET/ROXICET) 5-325 MG tablet Take 1 tablet by mouth every 4 (four) hours as needed (back pain).   rosuvastatin (CRESTOR) 20 MG tablet TAKE 1 TABLET BY MOUTH EVERY DAY   valsartan-hydrochlorothiazide (DIOVAN-HCT) 320-25 MG tablet Take 1 tablet by mouth daily.   No facility-administered medications prior to visit.    Review of Systems  Constitutional:  Negative for appetite change, chills and fever.  Respiratory:  Negative for chest tightness, shortness of breath and wheezing.   Cardiovascular:  Negative for chest pain and palpitations.  Gastrointestinal:  Negative for abdominal pain, nausea and vomiting.       Objective    BP (!) 162/68 (BP  Location: Left Arm, Patient Position: Sitting, Cuff Size: Normal)   Pulse 64   Temp 98.2 F (36.8 C) (Oral)   Ht 5\' 7"  (1.702 m)   Wt 185 lb (83.9 kg)   SpO2 100%   BMI 28.98 kg/m    Physical Exam  General appearance: Well developed, well nourished male, cooperative and in no acute distress Head: Normocephalic, without obvious abnormality, atraumatic Respiratory: Respirations even and unlabored, normal respiratory rate Extremities: All extremities are intact.  Skin: Skin color, texture, turgor normal. No rashes seen  Psych: Appropriate mood and affect. Neurologic: Mental status: Alert, oriented to person, place, and time, thought content appropriate.   Assessment & Plan     1. Essential (primary) hypertension Much better now that he is taking increased dose of valsartan hydrochlorothiazide consistently.  - Renal function panel  Discussed adding another medication such as diltiazem or spironolactone. He states he eat a large breakfast with sausage and bacon this morning which he thinks made his BP higher than usual. He wants to give it another month or so before deciding whether to add another medication. Advise his SBP should be under 130.   2. Acute prostatitis  - doxycycline (VIBRA-TABS) 100 MG tablet; Take 1 tablet (100 mg total) by mouth 2 (two) times daily for 10 days.  Dispense: 20 tablet; Refill: 0  3. Need for influenza vaccination  - Flu Vaccine Trivalent High Dose (Fluad)    Return in about 6 weeks (  around 03/29/2023) for Hypertension.         [name], MD  [facility] [phone] (phone) [phone] (fax)  [facility]

## 2023-02-16 LAB — RENAL FUNCTION PANEL
Albumin: 4.2 g/dL (ref 3.9–4.9)
BUN/Creatinine Ratio: 19 (ref 10–24)
BUN: 22 mg/dL (ref 8–27)
CO2: 25 mmol/L (ref 20–29)
Calcium: 8.6 mg/dL (ref 8.6–10.2)
Chloride: 105 mmol/L (ref 96–106)
Creatinine, Ser: 1.15 mg/dL (ref 0.76–1.27)
Glucose: 92 mg/dL (ref 70–99)
Phosphorus: 3.1 mg/dL (ref 2.8–4.1)
Potassium: 3.8 mmol/L (ref 3.5–5.2)
Sodium: 145 mmol/L — ABNORMAL HIGH (ref 134–144)
eGFR: 71 mL/min/{1.73_m2} (ref >59)

## 2023-02-21 ENCOUNTER — Other Ambulatory Visit: Payer: Self-pay | Admitting: Family Medicine

## 2023-02-21 DIAGNOSIS — M47816 Spondylosis without myelopathy or radiculopathy, lumbar region: Secondary | ICD-10-CM

## 2023-02-21 DIAGNOSIS — M533 Sacrococcygeal disorders, not elsewhere classified: Secondary | ICD-10-CM

## 2023-02-21 DIAGNOSIS — M48062 Spinal stenosis, lumbar region with neurogenic claudication: Secondary | ICD-10-CM

## 2023-02-21 NOTE — Telephone Encounter (Signed)
Medication Refill - Medication: Oxycodone 5/325  Has the patient contacted their pharmacy? Yes.   (Agent: If no, request that the patient contact the pharmacy for the refill. If patient does not wish to contact the pharmacy document the reason why and proceed with request.) (Agent: If yes, when and what did the pharmacy advise?)  Preferred Pharmacy (with phone number or street name): [facility] Has the patient been seen for an appointment in the last year OR does the patient have an upcoming appointment? Yes.    Agent: Please be advised that RX refills may take up to 3 business days. We ask that you follow-up with your pharmacy.

## 2023-02-22 NOTE — Telephone Encounter (Signed)
Requested medication (s) are due for refill today: yes  Requested medication (s) are on the active medication list: yes  Last refill:  01/19/23  Future visit scheduled: yes  Notes to clinic:  Unable to refill per protocol, cannot delegate.      Requested Prescriptions  Pending Prescriptions Disp Refills   oxyCODONE-acetaminophen (PERCOCET/ROXICET) 5-325 MG tablet 160 tablet 0    Sig: Take 1 tablet by mouth every 4 (four) hours as needed (back pain).     Not Delegated - Analgesics:  Opioid Agonist Combinations Failed - 02/21/2023  3:16 PM      Failed - This refill cannot be delegated      Failed - Urine Drug Screen completed in last 360 days      Passed - Valid encounter within last 3 months    Recent Outpatient Visits           1 week ago Essential (primary) hypertension   [place] [facility], [facility]   2 months ago Essential (primary) hypertension   [place] [facility], MD   2 months ago Uncontrolled hypertension   [place] [place], [place], PA-C   2 months ago Annual physical exam   [facility], [facility]   7 months ago Essential (primary) hypertension   [place] [facility] Malva Limes, MD       Future Appointments             In 1 month Fisher, Demetrios Isaacs, MD [facility], [facility]

## 2023-02-23 MED ORDER — OXYCODONE-ACETAMINOPHEN 5-325 MG PO TABS: 1.0000 | ORAL_TABLET | ORAL | 0 refills | Status: DC | PRN

## 2023-03-24 ENCOUNTER — Ambulatory Visit: Payer: BC Managed Care – PPO | Admitting: Family Medicine

## 2023-03-24 ENCOUNTER — Other Ambulatory Visit: Payer: Self-pay | Admitting: Family Medicine

## 2023-03-24 DIAGNOSIS — M533 Sacrococcygeal disorders, not elsewhere classified: Secondary | ICD-10-CM

## 2023-03-24 DIAGNOSIS — M47816 Spondylosis without myelopathy or radiculopathy, lumbar region: Secondary | ICD-10-CM

## 2023-03-24 DIAGNOSIS — M48062 Spinal stenosis, lumbar region with neurogenic claudication: Secondary | ICD-10-CM

## 2023-03-24 NOTE — Telephone Encounter (Signed)
Requested medication (s) are due for refill today: Yes  Requested medication (s) are on the active medication list: Yes  Last refill:  02/23/23  Future visit scheduled: Yes  Notes to clinic:  Unable to refill per protocol, cannot delegate.      Requested Prescriptions  Pending Prescriptions Disp Refills   oxyCODONE-acetaminophen (PERCOCET/ROXICET) 5-325 MG tablet 160 tablet 0    Sig: Take 1 tablet by mouth every 4 (four) hours as needed (back pain).     Not Delegated - Analgesics:  Opioid Agonist Combinations Failed - 03/24/2023  1:22 PM      Failed - This refill cannot be delegated      Failed - Urine Drug Screen completed in last 360 days      Passed - Valid encounter within last 3 months    Recent Outpatient Visits           1 month ago Essential (primary) hypertension   [place] [facility], MD   3 months ago Essential (primary) hypertension   [place] [facility] Malva Limes, MD   3 months ago Uncontrolled hypertension   [place] [facility] [place], [place], PA-C   3 months ago Annual physical exam   [facility], MD   8 months ago Essential (primary) hypertension   [facility] Malva Limes, MD       Future Appointments             In 1 month Fisher, Demetrios Isaacs, MD [facility], [facility]

## 2023-03-24 NOTE — Telephone Encounter (Signed)
Medication Refill -  Most Recent Primary Care Visit:  Provider: Malva Limes  Department[facility]  Visit Type: OFFICE VISIT  Date: 02/15/2023  Medication: oxyCODONE-acetaminophen (PERCOCET/ROXICET) 5-325 MG tablet [[id number]]  Has the patient contacted their pharmacy? No (  Is this the correct pharmacy for this prescription? Yes If no, delete pharmacy and type the correct one.  This is the patient's preferred pharmacy:  [id number] [place] [facility] - [address] [place] [address] Phone: [phone] Fax: [phone]   Has the prescription been filled recently? Yes  Is the patient out of the medication? No  Has the patient been seen for an appointment in the last year OR does the patient have an upcoming appointment? Yes  Can we respond through MyChart? yes  Agent: Please be advised that Rx refills may take up to 3 business days. We ask that you follow-up with your pharmacy.

## 2023-03-27 MED ORDER — OXYCODONE-ACETAMINOPHEN 5-325 MG PO TABS: 1.0000 | ORAL_TABLET | ORAL | 0 refills | Status: DC | PRN

## 2023-04-28 ENCOUNTER — Ambulatory Visit: Payer: BC Managed Care – PPO | Admitting: Family Medicine

## 2023-05-04 ENCOUNTER — Other Ambulatory Visit: Payer: Self-pay | Admitting: Family Medicine

## 2023-05-04 DIAGNOSIS — M533 Sacrococcygeal disorders, not elsewhere classified: Secondary | ICD-10-CM

## 2023-05-04 DIAGNOSIS — M47816 Spondylosis without myelopathy or radiculopathy, lumbar region: Secondary | ICD-10-CM

## 2023-05-04 DIAGNOSIS — M48062 Spinal stenosis, lumbar region with neurogenic claudication: Secondary | ICD-10-CM

## 2023-05-04 NOTE — Telephone Encounter (Signed)
Medication Refill -  Most Recent Primary Care Visit:  Provider: Malva Limes  Department[facility]  Visit Type: OFFICE VISIT  Date: 02/15/2023  Medication: oxyCODONE-acetaminophen (PERCOCET/ROXICET) 5-325 MG tablet   Has the patient contacted their pharmacy? No  Is this the correct pharmacy for this prescription? Yes  This is the patient's preferred pharmacy:  [id number] [place] [facility] - [address] [place] [address] Phone: [phone] Fax: [phone]   Has the prescription been filled recently? Yes  Is the patient out of the medication? Yes  Has the patient been seen for an appointment in the last year OR does the patient have an upcoming appointment? Yes  Can we respond through MyChart? No  Agent: Please be advised that Rx refills may take up to 3 business days. We ask that you follow-up with your pharmacy.

## 2023-05-05 NOTE — Telephone Encounter (Signed)
Requested medication (s) are due for refill today: yes  Requested medication (s) are on the active medication list: yes  Last refill:  03/27/23 #160  Future visit scheduled: yes  Notes to clinic:  med not delegated to NT to RF   Requested Prescriptions  Pending Prescriptions Disp Refills   oxyCODONE-acetaminophen (PERCOCET/ROXICET) 5-325 MG tablet 160 tablet 0    Sig: Take 1 tablet by mouth every 4 (four) hours as needed (back pain).     Not Delegated - Analgesics:  Opioid Agonist Combinations Failed - 05/05/2023  3:22 PM      Failed - This refill cannot be delegated      Failed - Urine Drug Screen completed in last 360 days      Passed - Valid encounter within last 3 months    Recent Outpatient Visits           2 months ago Essential (primary) hypertension   [place] [facility] Mal[facility] Limes, MD   4 months ago Essential (primary) hypertension   [place] [facility] Malva Limes, MD   4 months ago Uncontrolled hypertension   [place] [facility] [place], [place], PA-C   5 months ago Annual physical exam   [facility] Malva Limes, MD   10 months ago Essential (primary) hypertension    [facility] Malva Limes, MD       Future Appointments             In 1 month Fisher, Demetrios Isaacs, MD [facility], [facility]

## 2023-05-07 MED ORDER — OXYCODONE-ACETAMINOPHEN 5-325 MG PO TABS: 1.0000 | ORAL_TABLET | ORAL | 0 refills | Status: DC | PRN

## 2023-06-05 ENCOUNTER — Ambulatory Visit: Payer: BC Managed Care – PPO | Admitting: Family Medicine

## 2023-06-06 ENCOUNTER — Other Ambulatory Visit: Payer: Self-pay | Admitting: Family Medicine

## 2023-06-06 DIAGNOSIS — M48062 Spinal stenosis, lumbar region with neurogenic claudication: Secondary | ICD-10-CM

## 2023-06-06 DIAGNOSIS — M47816 Spondylosis without myelopathy or radiculopathy, lumbar region: Secondary | ICD-10-CM

## 2023-06-06 DIAGNOSIS — M533 Sacrococcygeal disorders, not elsewhere classified: Secondary | ICD-10-CM

## 2023-06-06 NOTE — Telephone Encounter (Signed)
Medication Refill -  Most Recent Primary Care Visit:  Provider: Malva Limes  Department: [facility]  Visit Type: OFFICE VISIT  Date: 02/15/2023  Medication: oxyCODONE-acetaminophen (PERCOCET/ROXICET) 5-325 MG tablet   Has the patient contacted their pharmacy? Yes Contact office.   Is this the correct pharmacy for this prescription? Yes This is the patient's preferred pharmacy:  [id number] [place] [facility] - [address] [place] [address] Phone: [phone] Fax: [phone]   Has the prescription been filled recently? No  Is the patient out of the medication? No  Has the patient been seen for an appointment in the last year OR does the patient have an upcoming appointment? Yes  Can we respond through MyChart? Yes  Agent: Please be advised that Rx refills may take up to 3 business days. We ask that you follow-up with your pharmacy.

## 2023-06-07 MED ORDER — OXYCODONE-ACETAMINOPHEN 5-325 MG PO TABS: 1.0000 | ORAL_TABLET | ORAL | 0 refills | Status: DC | PRN

## 2023-06-07 NOTE — Telephone Encounter (Signed)
Requested medication (s) are due for refill today - yes  Requested medication (s) are on the active medication list -yes  Future visit scheduled -yes  Last refill: 05/06/24 #160  Notes to clinic: non delegated Rx  Requested Prescriptions  Pending Prescriptions Disp Refills   oxyCODONE-acetaminophen (PERCOCET/ROXICET) 5-325 MG tablet 160 tablet 0    Sig: Take 1 tablet by mouth every 4 (four) hours as needed (back pain).     Not Delegated - Analgesics:  Opioid Agonist Combinations Failed - 06/07/2023  9:57 AM      Failed - This refill cannot be delegated      Failed - Urine Drug Screen completed in last 360 days      Failed - Valid encounter within last 3 months    Recent Outpatient Visits           3 months ago Essential (primary) hypertension   [place] [facility] Malva Limes, MD   5 months ago Essential (primary) hypertension   [place] [facility] Malva Limes, MD   6 months ago Uncontrolled hypertension   [place] [facility] [place], [place], PA-C   6 months ago Annual physical exam   [facility] MD   11 months ago Essential (primary) hypertension   [place] [facility] Malva Limes, MD       Future Appointments             In 2 weeks Fisher, Demetrios Isaacs, MD [facility]               Requested Prescriptions  Pending Prescriptions Disp Refills   oxyCODONE-acetaminophen (PERCOCET/ROXICET) 5-325 MG tablet 160 tablet 0    Sig: Take 1 tablet by mouth every 4 (four) hours as needed (back pain).     Not Delegated - Analgesics:  Opioid Agonist Combinations Failed - 06/07/2023  9:57 AM      Failed - This refill cannot be delegated      Failed - Urine Drug Screen completed in last 360 days      Failed - Valid encounter within last 3 months    Recent Outpatient Visits           3 months ago Essential  (primary) hypertension   [place] [facility] Malva Limes, MD   5 months ago Essential (primary) hypertension   [place] [facility] Malva Limes, MD   6 months ago Uncontrolled hypertension   [place] [facility] [place], [place], PA-C   6 months ago Annual physical exam   [facility] Malva Limes, MD   11 months ago Essential (primary) hypertension    [facility] Malva Limes, MD       Future Appointments             In 2 weeks Fisher, Demetrios Isaacs, MD [facility], [facility]

## 2023-06-23 ENCOUNTER — Ambulatory Visit: Payer: Self-pay | Admitting: Family Medicine

## 2023-07-12 ENCOUNTER — Other Ambulatory Visit: Payer: Self-pay | Admitting: Family Medicine

## 2023-07-12 DIAGNOSIS — M533 Sacrococcygeal disorders, not elsewhere classified: Secondary | ICD-10-CM

## 2023-07-12 DIAGNOSIS — M48062 Spinal stenosis, lumbar region with neurogenic claudication: Secondary | ICD-10-CM

## 2023-07-12 DIAGNOSIS — M47816 Spondylosis without myelopathy or radiculopathy, lumbar region: Secondary | ICD-10-CM

## 2023-07-12 NOTE — Telephone Encounter (Signed)
 Copied from CRM [id number]. Topic: Clinical - Medication Refill >> Jul 12, 2023  4:34 PM Emylou G wrote: Most Recent Primary Care Visit:  Provider: Malva Limes  [facility]  Visit Type: OFFICE VISIT  Date: 02/15/2023  Medication: oxyCODONE-acetaminophen (PERCOCET/ROXICET) 5-325 MG tablet  Has the patient contacted their pharmacy? No (Agent: If no, request that the patient contact the pharmacy for the refill. If patient does not wish to contact the pharmacy document the reason why and proceed with request.) (Agent: If yes, when and what did the pharmacy advise?)  Is this the correct pharmacy for this prescription? Yes If no, delete pharmacy and type the correct one.  This is the patient's preferred pharmacy:  [id number] [place] [facility] - [address] [place] [address] Phone: [phone] Fax: [phone]   Has the prescription been filled recently? Yes .Marland Kitchen Last month  Is the patient out of the medication? Yes .Marland Kitchen Few days left  Has the patient been seen for an appointment in the last year OR does the patient have an upcoming appointment? Yes  Can we respond through MyChart? No  Agent: Please be advised that Rx refills may take up to 3 business days. We ask that you follow-up with your pharmacy.

## 2023-07-13 NOTE — Telephone Encounter (Signed)
 Requested medication (s) are due for refill today - yes  Requested medication (s) are on the active medication list -yes  Future visit scheduled -no  Last refill: 06/07/23 #160  Notes to clinic: non delegated Rx  Requested Prescriptions  Pending Prescriptions Disp Refills   oxyCODONE-acetaminophen (PERCOCET/ROXICET) 5-325 MG tablet 160 tablet 0    Sig: Take 1 tablet by mouth every 4 (four) hours as needed (back pain).     Not Delegated - Analgesics:  Opioid Agonist Combinations Failed - 07/13/2023  9:35 AM      Failed - This refill cannot be delegated      Failed - Urine Drug Screen completed in last 360 days      Failed - Valid encounter within last 3 months    Recent Outpatient Visits           4 months ago Essential (primary) hypertension   [place] [facility] Malva Limes, MD   7 months ago Essential (primary) hypertension   [place] Malva Limes, MD   7 months ago Uncontrolled hypertension   [facility] [place], [place], PA-C   7 months ago Annual physical exam   [facility] Malva Limes, MD   1 year ago Essential (primary) hypertension   [place] [facility] Malva Limes, MD                 Requested Prescriptions  Pending Prescriptions Disp Refills   oxyCODONE-acetaminophen (PERCOCET/ROXICET) 5-325 MG tablet 160 tablet 0    Sig: Take 1 tablet by mouth every 4 (four) hours as needed (back pain).     Not Delegated - Analgesics:  Opioid Agonist Combinations Failed - 07/13/2023  9:35 AM      Failed - This refill cannot be delegated      Failed - Urine Drug Screen completed in last 360 days      Failed - Valid encounter within last 3 months    Recent Outpatient Visits           4 months ago Essential (primary) hypertension   [facility] Malva Limes, MD   7 months ago Essential (primary)  hypertension   [place] [facility] Malva Limes, MD   7 months ago Uncontrolled hypertension   [place] [facility] [place], PA-C   7 months ago Annual physical exam   [facility] Malva Limes, MD   1 year ago Essential (primary) hypertension   [place] Malva Limes, MD

## 2023-07-14 MED ORDER — OXYCODONE-ACETAMINOPHEN 5-325 MG PO TABS: 1.0000 | ORAL_TABLET | ORAL | 0 refills | Status: DC | PRN

## 2023-08-21 ENCOUNTER — Ambulatory Visit: Payer: Self-pay | Admitting: Family Medicine

## 2023-08-21 VITALS — BP 174/77 | HR 98 | Temp 98.5°F | Ht 67.0 in | Wt 185.0 lb

## 2023-08-21 DIAGNOSIS — F1190 Opioid use, unspecified, uncomplicated: Secondary | ICD-10-CM

## 2023-08-21 DIAGNOSIS — M47816 Spondylosis without myelopathy or radiculopathy, lumbar region: Secondary | ICD-10-CM

## 2023-08-21 DIAGNOSIS — M48062 Spinal stenosis, lumbar region with neurogenic claudication: Secondary | ICD-10-CM | POA: Diagnosis not present

## 2023-08-21 DIAGNOSIS — M533 Sacrococcygeal disorders, not elsewhere classified: Secondary | ICD-10-CM | POA: Diagnosis not present

## 2023-08-21 DIAGNOSIS — G8929 Other chronic pain: Secondary | ICD-10-CM

## 2023-08-21 DIAGNOSIS — M25511 Pain in right shoulder: Secondary | ICD-10-CM

## 2023-08-21 DIAGNOSIS — I10 Essential (primary) hypertension: Secondary | ICD-10-CM

## 2023-08-21 MED ORDER — FELODIPINE ER 5 MG PO TB24: 5.0000 mg | ORAL_TABLET | Freq: Every day | ORAL | 2 refills | Status: DC

## 2023-08-21 MED ORDER — OXYCODONE-ACETAMINOPHEN 5-325 MG PO TABS: 1.0000 | ORAL_TABLET | ORAL | 0 refills | Status: DC | PRN

## 2023-08-21 NOTE — Progress Notes (Unsigned)
 Established patient visit   Patient: Alexander Mcbride   DOB: 07/24/1955   68 y.o. Male  MRN: [id number] Visit Date: 08/21/2023  Today's healthcare provider: [name], MD   Chief Complaint  Patient presents with   Hypertension    Patient was last seen on 02/15/23 for his hypertension.  He reports good tolerance and compliance of his medication. Patient does not check his blood pressure at home much.  He also reports he may have taken his medications twice today.   Shoulder Pain    Patient has had pain in his right shoulder for about 1 month.  He states he has been told he has vertebral arthritis in the past and wonders if it is coming from that or something else.    Subjective    Discussed the use of AI scribe software for clinical note transcription with the patient, who gave verbal consent to proceed.  History of Present Illness   Alexander Mcbride "Alexander Mcbride" is a 67 year old male who presents for a follow-up on his blood pressure and pain medications.  He requires a refill for oxycodone, which he uses for chronic back pain secondar to lumbar stenosis. He takes valsartan HCT daily after lunch for hypertension. He monitors his blood pressure at home using an app via the camera of his phone, though he is uncertain about its accuracy, with typical readings around 110/70 mmHg. He previously tried amlodipine but was switched due to a rash, likely from lisinopril.  He reports recent onset shoulder pain that has persisted for about a month, particularly when sleeping or watching TV. The pain is localized over the shoulder and is described as sore, especially with arm movement. A previous CT scan showed arthritis in the vertebrae. The pain does not affect him while working but is bothersome during rest. He has used topical treatments like biofreeze, which provide temporary relief, and has started stretching exercises, which seem to help.  He acknowledges not adhering to  lifestyle modifications that could improve his condition, such as diet and smoking cessation. He vapes and does not follow a specific diet. No issues with blood pressure control during work, but shoulder pain is noted during rest.       Medications: Outpatient Medications Prior to Visit  Medication Sig   co-enzyme Q-10 50 MG capsule Take 50 mg by mouth daily.   rosuvastatin (CRESTOR) 20 MG tablet TAKE 1 TABLET BY MOUTH EVERY DAY   valsartan-hydrochlorothiazide (DIOVAN-HCT) 320-25 MG tablet Take 1 tablet by mouth daily.   [DISCONTINUED] oxyCODONE-acetaminophen (PERCOCET/ROXICET) 5-325 MG tablet Take 1 tablet by mouth every 4 (four) hours as needed (back pain).   No facility-administered medications prior to visit.   Review of Systems {Insert previous labs (optional):23779} {See past labs  Heme  Chem  Endocrine  Serology  Results Review (optional):1}   Objective    BP (!) 174/77 (BP Location: Left Arm, Patient Position: Sitting, Cuff Size: Normal)   Pulse 98   Temp 98.5 F (36.9 C) (Oral)   Ht 5\' 7"  (1.702 m)   Wt 185 lb (83.9 kg)   SpO2 98%   BMI 28.98 kg/m {Insert last BP/Wt (optional):23777}{See vitals history (optional):1}  Physical Exam   General appearance: Well developed, well nourished male, cooperative and in no acute distress Head: Normocephalic, without obvious abnormality, atraumatic Respiratory: Respirations even and unlabored, normal respiratory rate Extremities: All extremities are intact.  Skin: Skin color, texture, turgor normal. No rashes seen  Psych:  Appropriate mood and affect. Neurologic: Mental status: Alert, oriented to person, place, and time, thought content appropriate.    Assessment & Plan       Shoulder Pain Shoulder pain possibly due to tendonitis, with intermittent relief from stretching and topical treatments. Orthopedic evaluation needed. - Refer to orthopedics for evaluation and imaging. - Advise continued stretching and topical  treatments.  Hypertension Hypertension managed with valsartan HCT. Blood pressure generally 110/70 mmHg. Felodipine suggested for improved control. - Prescribe felodipine with valsartan HCT. - Advise consistent daily medication timing. - Continue home blood pressure monitoring.  Chronic Pain Management due to DDD, lumbar facet syndrome and lumbar spinal stenosis Chronic pain managed with oxycodone. Urine drug screens required for compliance and safety. - Obtain urine sample for drug screening. - Refill oxycodone prescription.    Return in about 4 months (around 12/21/2023) for Yearly Physical, Hypertension.      Jeralene Mom, MD  [facility] [phone] (phone) [phone] (fax)  [facility]

## 2023-08-21 NOTE — Patient Instructions (Signed)
 Alexander Mcbride  Please review the attached list of medications and notify my office if there are any errors.   . Please bring all of your medications to every appointment so we can make sure that our medication list is the same as yours.

## 2023-08-23 LAB — PAIN MGT SCRN (14 DRUGS), UR
Amphetamine Scrn, Ur: NEGATIVE ng/mL
BARBITURATE SCREEN URINE: NEGATIVE ng/mL
BENZODIAZEPINE SCREEN, URINE: NEGATIVE ng/mL
Buprenorphine, Urine: NEGATIVE ng/mL
CANNABINOIDS UR QL SCN: POSITIVE ng/mL — AB
Cocaine (Metab) Scrn, Ur: NEGATIVE ng/mL
Creatinine(Crt), U: 60.3 mg/dL (ref 20.0–300.0)
Fentanyl, Urine: NEGATIVE pg/mL
Meperidine Screen, Urine: NEGATIVE ng/mL
Methadone Screen, Urine: NEGATIVE ng/mL
OXYCODONE+OXYMORPHONE UR QL SCN: POSITIVE ng/mL — AB
Opiate Scrn, Ur: NEGATIVE ng/mL
Ph of Urine: 6.5 (ref 4.5–8.9)
Phencyclidine Qn, Ur: NEGATIVE ng/mL
Propoxyphene Scrn, Ur: NEGATIVE ng/mL
Tramadol Screen, Urine: NEGATIVE ng/mL

## 2023-09-28 ENCOUNTER — Other Ambulatory Visit: Payer: Self-pay | Admitting: Family Medicine

## 2023-09-28 DIAGNOSIS — M533 Sacrococcygeal disorders, not elsewhere classified: Secondary | ICD-10-CM

## 2023-09-28 DIAGNOSIS — M48062 Spinal stenosis, lumbar region with neurogenic claudication: Secondary | ICD-10-CM

## 2023-09-28 DIAGNOSIS — M47816 Spondylosis without myelopathy or radiculopathy, lumbar region: Secondary | ICD-10-CM

## 2023-09-28 NOTE — Telephone Encounter (Signed)
 Copied from CRM [id number]. Topic: Clinical - Medication Refill >> Sep 28, 2023  3:06 PM Deaijah H wrote: Medication: oxyCODONE -acetaminophen  (PERCOCET/ROXICET) 5-325 MG tablet  Has the patient contacted their pharmacy? No (Agent: If no, request that the patient contact the pharmacy for the refill. If patient does not wish to contact the pharmacy document the reason why and proceed with request.) (Agent: If yes, when and what did the pharmacy advise?)  This is the patient's preferred pharmacy:  [id number] [address] [place] [address] Phone: [phone] Fax: [phone]  Is this the correct pharmacy for this prescription? Yes If no, delete pharmacy and type the correct one.   Has the prescription been filled recently? Yes  Is the patient out of the medication? No, 2 days left  Has the patient been seen for an appointment in the last year OR does the patient have an upcoming appointment? Yes  Can we respond through MyChart? Yes  Agent: Please be advised that Rx refills may take up to 3 business days. We ask that you follow-up with your pharmacy.

## 2023-09-29 MED ORDER — OXYCODONE-ACETAMINOPHEN 5-325 MG PO TABS: 1.0000 | ORAL_TABLET | ORAL | 0 refills | Status: DC | PRN

## 2023-09-29 NOTE — Telephone Encounter (Signed)
 Requested medication (s) are due for refill today: yes  Requested medication (s) are on the active medication list: yes  Last refill:  08/21/23 #160 tabs  Future visit scheduled: no  Notes to clinic:  med not delegated to NT to RF   Requested Prescriptions  Pending Prescriptions Disp Refills   oxyCODONE -acetaminophen  (PERCOCET/ROXICET) 5-325 MG tablet 160 tablet 0    Sig: Take 1 tablet by mouth every 4 (four) hours as needed (back pain).     Not Delegated - Analgesics:  Opioid Agonist Combinations Failed - 09/29/2023  4:00 PM      Failed - This refill cannot be delegated      Failed - Urine Drug Screen completed in last 360 days      Passed - Valid encounter within last 3 months    Recent Outpatient Visits           1 month ago Chronic right shoulder pain   [facility] Alexander Pillow, MD

## 2023-10-04 ENCOUNTER — Ambulatory Visit: Payer: Self-pay

## 2023-10-04 NOTE — Telephone Encounter (Signed)
 Chief Complaint: lower abd pain Symptoms: urinary discomfort, abd pain, flank pain Frequency: since Monday Pertinent Negatives: Patient denies hematuria Disposition: [] ED /[x] Urgent Care (no appt availability in office) / [] Appointment(In office/virtual)/ []  [facility]/ [] Home Care/ [x] Refused Recommended Disposition /[] [place] [facility]/ []  Follow-up with PCP  Additional Notes: pt states he is experiencing lower abd pain. States he thinks he is experiencing kidney stones as he has had them before. States pain is in his lower abd and then radiates to  right side and back. States that he has has a discomfort when urinating. Denies any blood in urine. States that this started on Monday. States he was runny a fever on Monday. Pt states pain 5/10. Pt states that he doesn't want to go to UC, he is requesting a work excuse and to come in and do a urinalysis.  Called [facility] and spoke with [facility], no availability.   Copied from CRM [id number]. Topic: Clinical - Red Word Triage >> Oct 04, 2023  8:43 AM El Gravely T wrote: [facility] that prompted transfer to Nurse Triage: Increased Bladder Pain, concerns of possible stone Reason for Disposition  [1] MILD-MODERATE pain AND [2] constant AND [3] present > 2 hours  Protocols used: Abdominal Pain - Male-A-AH

## 2023-10-05 DIAGNOSIS — R109 Unspecified abdominal pain: Secondary | ICD-10-CM | POA: Diagnosis not present

## 2023-10-05 DIAGNOSIS — N390 Urinary tract infection, site not specified: Secondary | ICD-10-CM | POA: Diagnosis not present

## 2023-10-05 DIAGNOSIS — R3129 Other microscopic hematuria: Secondary | ICD-10-CM | POA: Diagnosis not present

## 2023-10-05 NOTE — Telephone Encounter (Signed)
 Called with no answer unable to leave message mb is full. Will attempt at a later time.

## 2023-10-05 NOTE — Telephone Encounter (Signed)
 FYI Spoke with the patient by phone. He stated that he was seen and evaluated at [facility], where he received treatment. No concerns were expressed at this time.

## 2023-10-05 NOTE — Telephone Encounter (Signed)
 Needs to go to urgent care

## 2023-11-09 ENCOUNTER — Other Ambulatory Visit: Payer: Self-pay | Admitting: Family Medicine

## 2023-11-09 DIAGNOSIS — M48062 Spinal stenosis, lumbar region with neurogenic claudication: Secondary | ICD-10-CM

## 2023-11-09 DIAGNOSIS — M47816 Spondylosis without myelopathy or radiculopathy, lumbar region: Secondary | ICD-10-CM

## 2023-11-09 DIAGNOSIS — M533 Sacrococcygeal disorders, not elsewhere classified: Secondary | ICD-10-CM

## 2023-11-09 NOTE — Telephone Encounter (Signed)
 Copied from CRM [id number]. Topic: Clinical - Medication Refill >> Nov 09, 2023  8:15 AM Janeecia G wrote: Medication: oxycodone   Has the patient contacted their pharmacy? No (Agent: If no, request that the patient contact the pharmacy for the refill. If patient does not wish to contact the pharmacy document the reason why and proceed with request.) (Agent: If yes, when and what did the pharmacy advise?)  This is the patient's preferred pharmacy:  [id number] - [address] [place] [address] Phone: [phone] Fax: [phone]  Is this the correct pharmacy for this prescription? Yes If no, delete pharmacy and type the correct one.   Has the prescription been filled recently? No  Is the patient out of the medication? No  Has the patient been seen for an appointment in the last year OR does the patient have an upcoming appointment? Yes  Can we respond through MyChart? No  Agent: Please be advised that Rx refills may take up to 3 business days. We ask that you follow-up with your pharmacy.

## 2023-11-13 NOTE — Telephone Encounter (Signed)
 Requested medications are due for refill today.  yes  Requested medications are on the active medications list.  yes  Last refill. 09/29/2023 #160 0 rf  Future visit scheduled.   no  Notes to clinic.  Refill not delegated.    Requested Prescriptions  Pending Prescriptions Disp Refills   oxyCODONE -acetaminophen  (PERCOCET/ROXICET) 5-325 MG tablet 160 tablet 0    Sig: Take 1 tablet by mouth every 4 (four) hours as needed (back pain).     Not Delegated - Analgesics:  Opioid Agonist Combinations Failed - 11/13/2023  2:31 PM      Failed - This refill cannot be delegated      Failed - Urine Drug Screen completed in last 360 days      Passed - Valid encounter within last 3 months    Recent Outpatient Visits           2 months ago Chronic right shoulder pain   [facility] [place] Nancyann BRAVO, MD

## 2023-11-18 ENCOUNTER — Other Ambulatory Visit: Payer: Self-pay | Admitting: Family Medicine

## 2023-11-18 DIAGNOSIS — I10 Essential (primary) hypertension: Secondary | ICD-10-CM

## 2023-11-22 ENCOUNTER — Other Ambulatory Visit: Payer: Self-pay | Admitting: Family Medicine

## 2023-11-22 DIAGNOSIS — M48062 Spinal stenosis, lumbar region with neurogenic claudication: Secondary | ICD-10-CM

## 2023-11-22 DIAGNOSIS — M47816 Spondylosis without myelopathy or radiculopathy, lumbar region: Secondary | ICD-10-CM

## 2023-11-22 DIAGNOSIS — M533 Sacrococcygeal disorders, not elsewhere classified: Secondary | ICD-10-CM

## 2023-11-22 MED ORDER — OXYCODONE-ACETAMINOPHEN 5-325 MG PO TABS: 1.0000 | ORAL_TABLET | ORAL | 0 refills | Status: DC | PRN

## 2023-12-21 ENCOUNTER — Other Ambulatory Visit: Payer: Self-pay | Admitting: Family Medicine

## 2023-12-21 DIAGNOSIS — E785 Hyperlipidemia, unspecified: Secondary | ICD-10-CM

## 2024-01-01 NOTE — Telephone Encounter (Unsigned)
 Copied from CRM #[id number]. Topic: Clinical - Medication Refill >> Jan 01, 2024  4:37 PM Leonette P wrote: Medication: Oxycodone  5/325  Has the patient contacted their pharmacy? No  This is the patient's preferred pharmacy:  [id number] [place] - [address] [place] [address] Phone: [phone] Fax: [phone]  Is this the correct pharmacy for this prescription? Yes If no, delete pharmacy and type the correct one.   Has the prescription been filled recently? Yes  Is the patient out of the medication? No  Has the patient been seen for an appointment in the last year OR does the patient have an upcoming appointment? No  Can we respond through MyChart? {yes/no:20286}  Agent: Please be advised that Rx refills may take up to 3 business days. We ask that you follow-up with your pharmacy.

## 2024-01-15 ENCOUNTER — Other Ambulatory Visit: Payer: Self-pay | Admitting: Family Medicine

## 2024-01-15 DIAGNOSIS — M48062 Spinal stenosis, lumbar region with neurogenic claudication: Secondary | ICD-10-CM

## 2024-01-15 DIAGNOSIS — M533 Sacrococcygeal disorders, not elsewhere classified: Secondary | ICD-10-CM

## 2024-01-15 DIAGNOSIS — M47816 Spondylosis without myelopathy or radiculopathy, lumbar region: Secondary | ICD-10-CM

## 2024-01-15 NOTE — Telephone Encounter (Unsigned)
 Copied from CRM [id number]. Topic: Clinical - Medication Refill >> Jan 15, 2024  2:51 PM Ameerah G wrote: Medication: oxyCODONE -acetaminophen  (PERCOCET/ROXICET) 5-325 MG tablet [[id number]]  Has the patient contacted their pharmacy? Yes (Agent: If no, request that the patient contact the pharmacy for the refill. If patient does not wish to contact the pharmacy document the reason why and proceed with request.) (Agent: If yes, when and what did the pharmacy advise?)  This is the patient's preferred pharmacy:  [id number] [place] [facility] - [address] [place] [address] Phone: [phone] Fax: [phone]  Is this the correct pharmacy for this prescription? Yes If no, delete pharmacy and type the correct one.   Has the prescription been filled recently? No  Is the patient out of the medication? No  Has the patient been seen for an appointment in the last year OR does the patient have an upcoming appointment? Yes  Can we respond through MyChart? Yes  Agent: Please be advised that Rx refills may take up to 3 business days. We ask that you follow-up with your pharmacy.

## 2024-01-16 ENCOUNTER — Other Ambulatory Visit: Payer: Self-pay | Admitting: Family Medicine

## 2024-01-16 DIAGNOSIS — I10 Essential (primary) hypertension: Secondary | ICD-10-CM

## 2024-01-16 NOTE — Telephone Encounter (Signed)
 Requested medication (s) are due for refill today: yes  Requested medication (s) are on the active medication list: yes  Last refill:  11/22/23  Future visit scheduled: yes  Notes to clinic:  Unable to refill per protocol, cannot delegate.      Requested Prescriptions  Pending Prescriptions Disp Refills   oxyCODONE -acetaminophen  (PERCOCET/ROXICET) 5-325 MG tablet 160 tablet 0    Sig: Take 1 tablet by mouth every 4 (four) hours as needed (back pain).     Not Delegated - Analgesics:  Opioid Agonist Combinations Failed - 01/16/2024  3:39 PM      Failed - This refill cannot be delegated      Failed - Urine Drug Screen completed in last 360 days      Failed - Valid encounter within last 3 months    Recent Outpatient Visits           4 months ago Chronic right shoulder pain   [facility] Nancyann BRAVO, MD

## 2024-01-17 MED ORDER — OXYCODONE-ACETAMINOPHEN 5-325 MG PO TABS: 1.0000 | ORAL_TABLET | ORAL | 0 refills | Status: DC | PRN

## 2024-02-09 ENCOUNTER — Ambulatory Visit: Admitting: Family Medicine

## 2024-02-29 ENCOUNTER — Other Ambulatory Visit: Payer: Self-pay | Admitting: Family Medicine

## 2024-02-29 DIAGNOSIS — M48062 Spinal stenosis, lumbar region with neurogenic claudication: Secondary | ICD-10-CM

## 2024-02-29 DIAGNOSIS — M533 Sacrococcygeal disorders, not elsewhere classified: Secondary | ICD-10-CM

## 2024-02-29 DIAGNOSIS — M47816 Spondylosis without myelopathy or radiculopathy, lumbar region: Secondary | ICD-10-CM

## 2024-02-29 NOTE — Telephone Encounter (Signed)
 Copied from CRM [id number]. Topic: Clinical - Medication Refill >> Feb 29, 2024  9:41 AM Amy B wrote: Medication: oxyCODONE -acetaminophen  (PERCOCET/ROXICET) 5-325 MG tablet  Has the patient contacted their pharmacy? No (Agent: If no, request that the patient contact the pharmacy for the refill. If patient does not wish to contact the pharmacy document the reason why and proceed with request.) (Agent: If yes, when and what did the pharmacy advise?)  This is the patient's preferred pharmacy:  [id number] [place] [facility] - [address] [place] [address] Phone: [phone] Fax: [phone]  Is this the correct pharmacy for this prescription? Yes If no, delete pharmacy and type the correct one.   Has the prescription been filled recently? No  Is the patient out of the medication? Yes  Has the patient been seen for an appointment in the last year OR does the patient have an upcoming appointment? Yes  Can we respond through MyChart? No patient prefers phone call  Agent: Please be advised that Rx refills may take up to 3 business days. We ask that you follow-up with your pharmacy.

## 2024-03-01 NOTE — Telephone Encounter (Signed)
 Requested medications are due for refill today.  yes  Requested medications are on the active medications list.  yes  Last refill. 01/17/2024 #160 0 rf  Future visit scheduled.   no  Notes to clinic.  Refill not delegated.    Requested Prescriptions  Pending Prescriptions Disp Refills   oxyCODONE -acetaminophen  (PERCOCET/ROXICET) 5-325 MG tablet 160 tablet 0    Sig: Take 1 tablet by mouth every 4 (four) hours as needed (back pain).     Not Delegated - Analgesics:  Opioid Agonist Combinations Failed - 03/01/2024  2:26 PM      Failed - This refill cannot be delegated      Failed - Urine Drug Screen completed in last 360 days      Failed - Valid encounter within last 3 months    Recent Outpatient Visits           6 months ago Chronic right shoulder pain   [facility] Nancyann BRAVO, MD

## 2024-03-03 MED ORDER — OXYCODONE-ACETAMINOPHEN 5-325 MG PO TABS: 1.0000 | ORAL_TABLET | ORAL | 0 refills | Status: DC | PRN

## 2024-03-13 ENCOUNTER — Ambulatory Visit: Admitting: Family Medicine

## 2024-03-13 ENCOUNTER — Encounter: Payer: Self-pay | Admitting: Family Medicine

## 2024-03-13 VITALS — BP 140/74 | HR 84 | Ht 67.0 in | Wt 188.2 lb

## 2024-03-13 DIAGNOSIS — Z23 Encounter for immunization: Secondary | ICD-10-CM | POA: Diagnosis not present

## 2024-03-13 DIAGNOSIS — M48062 Spinal stenosis, lumbar region with neurogenic claudication: Secondary | ICD-10-CM

## 2024-03-13 DIAGNOSIS — F1190 Opioid use, unspecified, uncomplicated: Secondary | ICD-10-CM

## 2024-03-13 DIAGNOSIS — E785 Hyperlipidemia, unspecified: Secondary | ICD-10-CM | POA: Diagnosis not present

## 2024-03-13 DIAGNOSIS — I10 Essential (primary) hypertension: Secondary | ICD-10-CM

## 2024-03-13 DIAGNOSIS — M722 Plantar fascial fibromatosis: Secondary | ICD-10-CM

## 2024-03-13 DIAGNOSIS — R739 Hyperglycemia, unspecified: Secondary | ICD-10-CM

## 2024-03-13 DIAGNOSIS — Z1211 Encounter for screening for malignant neoplasm of colon: Secondary | ICD-10-CM

## 2024-03-13 DIAGNOSIS — Z125 Encounter for screening for malignant neoplasm of prostate: Secondary | ICD-10-CM

## 2024-03-13 NOTE — Patient Instructions (Signed)
 SABRA  Please review the attached list of medications and notify my office if there are any errors.   . Please bring all of your medications to every appointment so we can make sure that our medication list is the same as yours.
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
## 2024-03-14 ENCOUNTER — Ambulatory Visit: Payer: Self-pay | Admitting: Family Medicine

## 2024-03-14 LAB — COMPREHENSIVE METABOLIC PANEL WITH GFR
ALT: 27 IU/L (ref 0–44)
AST: 28 IU/L (ref 0–40)
Albumin: 4.6 g/dL (ref 3.9–4.9)
Alkaline Phosphatase: 100 IU/L (ref 47–123)
BUN/Creatinine Ratio: 16 (ref 10–24)
BUN: 22 mg/dL (ref 8–27)
Bilirubin Total: 1.3 mg/dL — ABNORMAL HIGH (ref 0.0–1.2)
CO2: 23 mmol/L (ref 20–29)
Calcium: 9.7 mg/dL (ref 8.6–10.2)
Chloride: 102 mmol/L (ref 96–106)
Creatinine, Ser: 1.39 mg/dL — ABNORMAL HIGH (ref 0.76–1.27)
Globulin, Total: 2.6 g/dL (ref 1.5–4.5)
Glucose: 83 mg/dL (ref 70–99)
Potassium: 4.3 mmol/L (ref 3.5–5.2)
Sodium: 142 mmol/L (ref 134–144)
Total Protein: 7.2 g/dL (ref 6.0–8.5)
eGFR: 56 mL/min/1.73 — ABNORMAL LOW (ref >59)

## 2024-03-14 LAB — CBC
Hematocrit: 41.9 % (ref 37.5–51.0)
Hemoglobin: 14.2 g/dL (ref 13.0–17.7)
MCH: 30.1 pg (ref 26.6–33.0)
MCHC: 33.9 g/dL (ref 31.5–35.7)
MCV: 89 fL (ref 79–97)
Platelets: 267 x10E3/uL (ref 150–450)
RBC: 4.71 x10E6/uL (ref 4.14–5.80)
RDW: 12.8 % (ref 11.6–15.4)
WBC: 8.7 x10E3/uL (ref 3.4–10.8)

## 2024-03-14 LAB — LIPID PANEL
Chol/HDL Ratio: 3 ratio (ref 0.0–5.0)
Cholesterol, Total: 138 mg/dL (ref 100–199)
HDL: 46 mg/dL (ref >39)
LDL Chol Calc (NIH): 48 mg/dL (ref 0–99)
Triglycerides: 288 mg/dL — ABNORMAL HIGH (ref 0–149)
VLDL Cholesterol Cal: 44 mg/dL — ABNORMAL HIGH (ref 5–40)

## 2024-03-14 LAB — PSA TOTAL (REFLEX TO FREE): Prostate Specific Ag, Serum: 0.9 ng/mL (ref 0.0–4.0)

## 2024-03-14 LAB — HEMOGLOBIN A1C
Est. average glucose Bld gHb Est-mCnc: 117 mg/dL
Hgb A1c MFr Bld: 5.7 % — ABNORMAL HIGH (ref 4.8–5.6)

## 2024-03-17 ENCOUNTER — Other Ambulatory Visit: Payer: Self-pay | Admitting: Family Medicine

## 2024-03-17 DIAGNOSIS — M48062 Spinal stenosis, lumbar region with neurogenic claudication: Secondary | ICD-10-CM

## 2024-03-17 DIAGNOSIS — M47816 Spondylosis without myelopathy or radiculopathy, lumbar region: Secondary | ICD-10-CM

## 2024-03-17 DIAGNOSIS — M533 Sacrococcygeal disorders, not elsewhere classified: Secondary | ICD-10-CM

## 2024-03-17 MED ORDER — OXYCODONE-ACETAMINOPHEN 5-325 MG PO TABS: 1.0000 | ORAL_TABLET | ORAL | 0 refills | Status: DC | PRN

## 2024-04-05 ENCOUNTER — Telehealth: Payer: Self-pay

## 2024-04-25 ENCOUNTER — Other Ambulatory Visit: Payer: Self-pay | Admitting: Family Medicine

## 2024-04-25 DIAGNOSIS — M48062 Spinal stenosis, lumbar region with neurogenic claudication: Secondary | ICD-10-CM

## 2024-04-25 DIAGNOSIS — M533 Sacrococcygeal disorders, not elsewhere classified: Secondary | ICD-10-CM

## 2024-04-25 DIAGNOSIS — M47816 Spondylosis without myelopathy or radiculopathy, lumbar region: Secondary | ICD-10-CM

## 2024-04-25 NOTE — Telephone Encounter (Unsigned)
 Copied from CRM #[id number]. Topic: Clinical - Medication Refill >> Apr 25, 2024  4:05 PM Shanda MATSU wrote: Medication: oxyCODONE -acetaminophen  (PERCOCET/ROXICET) 5-325 MG tablet  Has the patient contacted their pharmacy? Yes, referred to provider (Agent: If no, request that the patient contact the pharmacy for the refill. If patient does not wish to contact the pharmacy document the reason why and proceed with request.) (Agent: If yes, when and what did the pharmacy advise?)  This is the patient's preferred pharmacy:  [id number] [address] [facility] - [address] [place] [address] Phone: [phone] Fax: [phone]  Is this the correct pharmacy for this prescription? Yes If no, delete pharmacy and type the correct one.   Has the prescription been filled recently? No  Is the patient out of the medication? No  Has the patient been seen for an appointment in the last year OR does the patient have an upcoming appointment? Yes  Can we respond through MyChart? Yes  Agent: Please be advised that Rx refills may take up to 3 business days. We ask that you follow-up with your pharmacy.

## 2024-04-27 NOTE — Telephone Encounter (Signed)
 Requested medication (s) are due for refill today: Yes  Requested medication (s) are on the active medication list: Yes  Last refill:  03/17/24  Future visit scheduled: No  Notes to clinic:  Unable to refill per protocol, cannot delegate.      Requested Prescriptions  Pending Prescriptions Disp Refills   oxyCODONE -acetaminophen  (PERCOCET/ROXICET) 5-325 MG tablet 160 tablet 0    Sig: Take 1 tablet by mouth every 4 (four) hours as needed (back pain).     Not Delegated - Analgesics:  Opioid Agonist Combinations Failed - 04/27/2024  8:50 AM      Failed - This refill cannot be delegated      Failed - Urine Drug Screen completed in last 360 days      Passed - Valid encounter within last 3 months    Recent Outpatient Visits           1 month ago Essential (primary) hypertension   [facility] Nancyann BRAVO, [facility]   8 months ago Chronic right shoulder pain   [facility] Gas[facility] Nancyann BRAVO, [facility]

## 2024-04-29 MED ORDER — OXYCODONE-ACETAMINOPHEN 5-325 MG PO TABS: 1.0000 | ORAL_TABLET | ORAL | 0 refills | Status: DC | PRN

## 2024-05-09 ENCOUNTER — Other Ambulatory Visit: Payer: Self-pay | Admitting: Family Medicine

## 2024-05-09 DIAGNOSIS — I10 Essential (primary) hypertension: Secondary | ICD-10-CM

## 2024-05-09 MED ORDER — FELODIPINE ER 10 MG PO TB24: 10.0000 mg | ORAL_TABLET | Freq: Every day | ORAL | 1 refills | Status: AC

## 2024-05-29 ENCOUNTER — Other Ambulatory Visit: Payer: Self-pay | Admitting: Family Medicine

## 2024-05-29 DIAGNOSIS — M533 Sacrococcygeal disorders, not elsewhere classified: Secondary | ICD-10-CM

## 2024-05-29 DIAGNOSIS — M47816 Spondylosis without myelopathy or radiculopathy, lumbar region: Secondary | ICD-10-CM

## 2024-05-29 DIAGNOSIS — M48062 Spinal stenosis, lumbar region with neurogenic claudication: Secondary | ICD-10-CM

## 2024-05-29 NOTE — Telephone Encounter (Unsigned)
 Copied from CRM #[id number]. Topic: Clinical - Medication Refill >> May 29, 2024  4:24 PM Santiya F wrote: Medication: oxyCODONE -acetaminophen  (PERCOCET/ROXICET) 5-325 MG tablet [[id number]]  Has the patient contacted their pharmacy? Yes  (Agent: If yes, when and what did the pharmacy advise?) Contact office   This is the patient's preferred pharmacy:  [id number] [address] - [address] [place] [address] Phone: [phone] Fax: [phone]  Is this the correct pharmacy for this prescription? Yes If no, delete pharmacy and type the correct one.   Has the prescription been filled recently? Yes  Is the patient out of the medication? No,has a few left   Has the patient been seen for an appointment in the last year OR does the patient have an upcoming appointment? Yes  Can we respond through MyChart? No  Agent: Please be advised that Rx refills may take up to 3 business days. We ask that you follow-up with your pharmacy.

## 2024-05-30 MED ORDER — OXYCODONE-ACETAMINOPHEN 5-325 MG PO TABS: 1.0000 | ORAL_TABLET | ORAL | 0 refills | Status: AC | PRN

## 2024-05-30 NOTE — Telephone Encounter (Signed)
 Requested medication (s) are due for refill today: Yes  Requested medication (s) are on the active medication list: Yes  Last refill:  04/29/24  Future visit scheduled: No  Notes to clinic:  Not delegated.    Requested Prescriptions  Pending Prescriptions Disp Refills   oxyCODONE -acetaminophen  (PERCOCET/ROXICET) 5-325 MG tablet 160 tablet 0    Sig: Take 1 tablet by mouth every 4 (four) hours as needed (back pain).     Not Delegated - Analgesics:  Opioid Agonist Combinations Failed - 05/30/2024  9:44 AM      Failed - This refill cannot be delegated      Failed - Urine Drug Screen completed in last 360 days      Passed - Valid encounter within last 3 months    Recent Outpatient Visits           2 months ago Essential (primary) hypertension   [place] [facility] Nancyann BRAVO, MD   9 months ago Chronic right shoulder pain   [facility] Gas[facility] Nancyann BRAVO, [facility]
